# Patient Record
Sex: Female | Born: 1940 | Race: White | Hispanic: No | Smoking: Never smoker
Health system: Southern US, Community
[De-identification: ages and names within clinical notes are randomized; demographics above are authoritative.]

## PROBLEM LIST (undated history)

## (undated) DIAGNOSIS — J45909 Unspecified asthma, uncomplicated: Secondary | ICD-10-CM

## (undated) HISTORY — DX: Unspecified asthma, uncomplicated: J45.909

---

## 2009-06-19 ENCOUNTER — Emergency Department: Payer: Self-pay | Admitting: Emergency Medicine

## 2010-08-05 ENCOUNTER — Inpatient Hospital Stay: Payer: Self-pay | Admitting: Internal Medicine

## 2011-08-26 IMAGING — CT CT ABD-PELV W/ CM
1 of 2 series · 15 of 32 positions shown, 19 images · IV contrast (isovue)
Comparison: None

REASON FOR EXAM: (1) abdominal pain, epigastric, neg us,; (2) abdominal
pain, epigastric, neg us,
COMMENTS:

PROCEDURE:     CT  - CT ABDOMEN / PELVIS  W  - August 05, 2010 [DATE]
RESULT:     History: Abdominal pain
TECHNIQUE: Multiple axial images of the abdomen and pelvis were performed
from the lung bases to the pubic symphysis, with p.o. contrast and with 100
ml of Isovue 370 intravenous contrast.

[Series 2: 3mm soft tissue · axial · 0.66mm/px · z∈[-563,-155]mm · 15 of 150 slices shown, 19 images]
[im 7/150  soft-tissue]
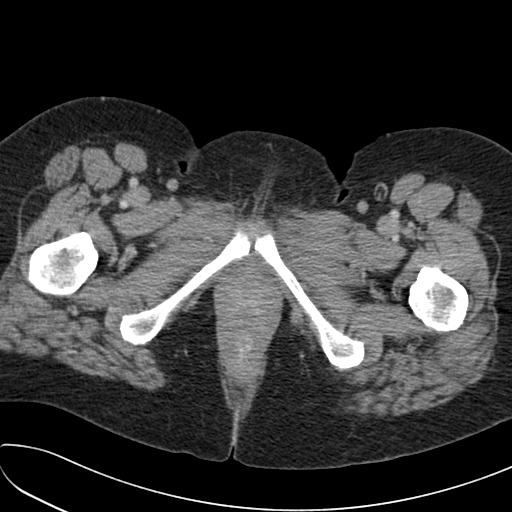
[im 7/150  bone]
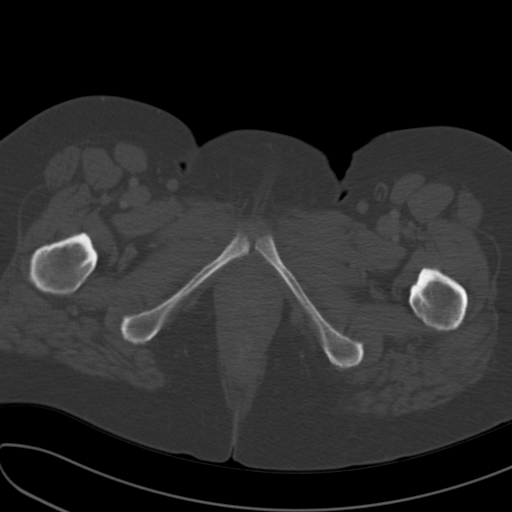
[im 20/150  soft-tissue]
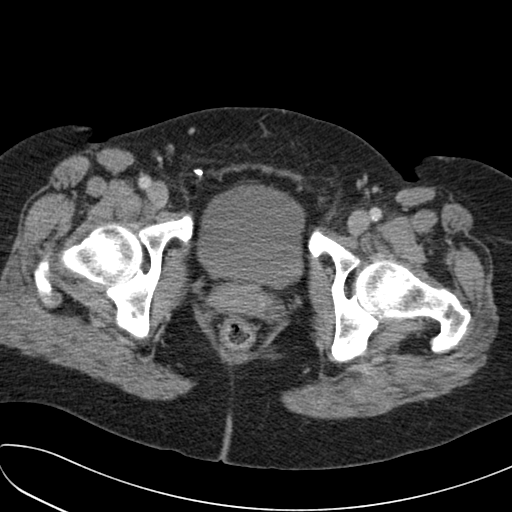
[im 33/150  soft-tissue]
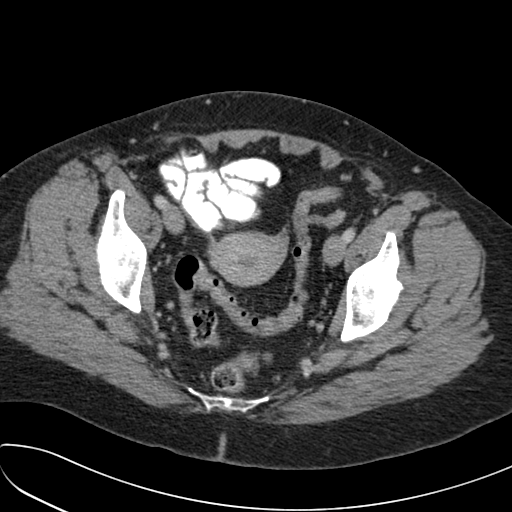
[im 39/150  soft-tissue]
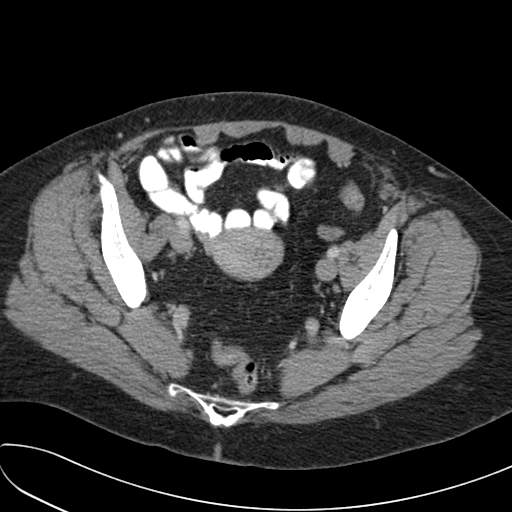
[im 52/150  soft-tissue]
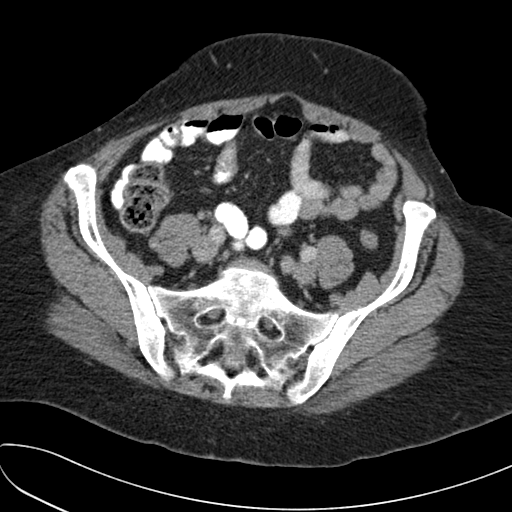
[im 65/150  soft-tissue]
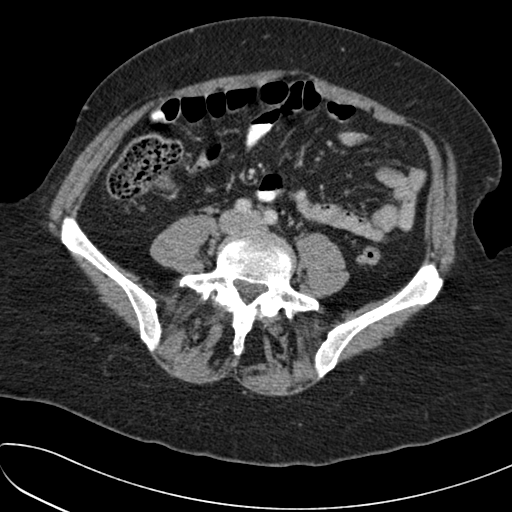
[im 78/150  soft-tissue]
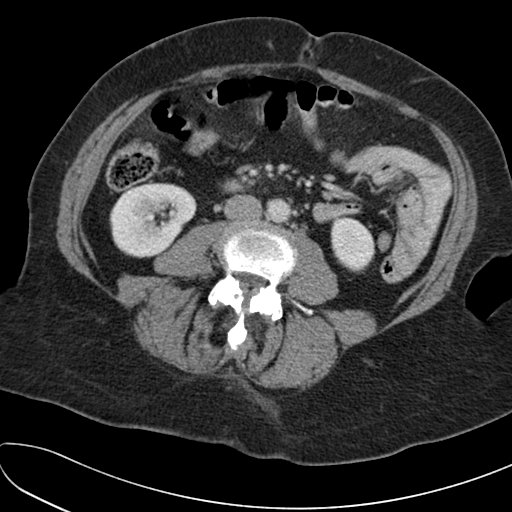
[im 85/150  soft-tissue]
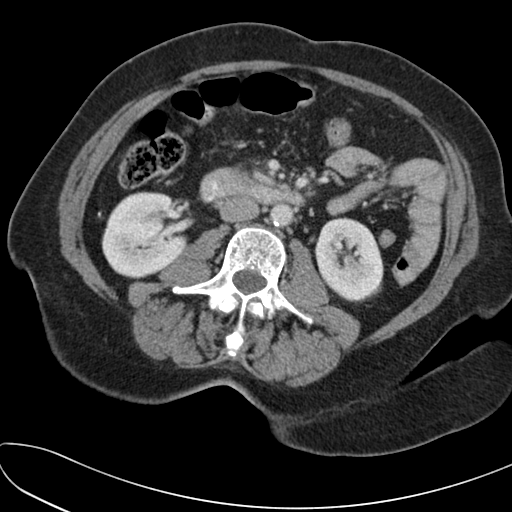
[im 98/150  soft-tissue]
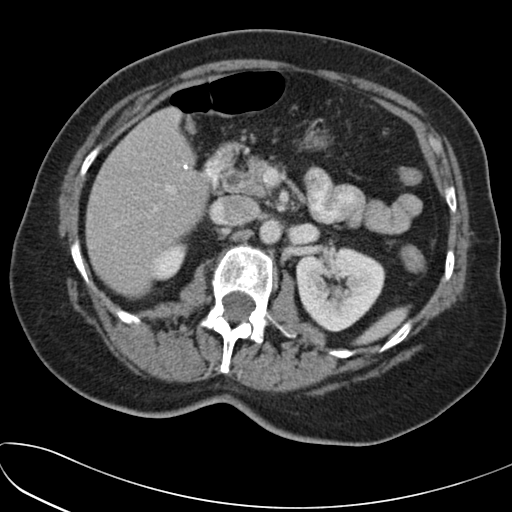
[im 98/150  bone]
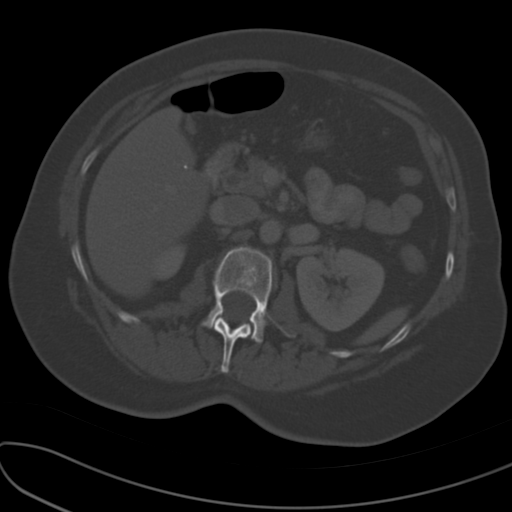
[im 111/150  soft-tissue]
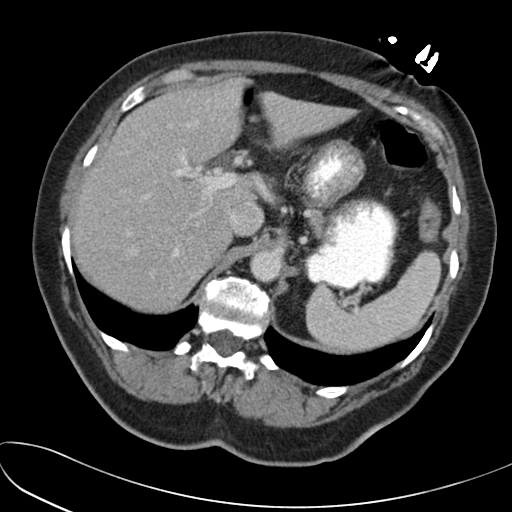
[im 117/150  soft-tissue]
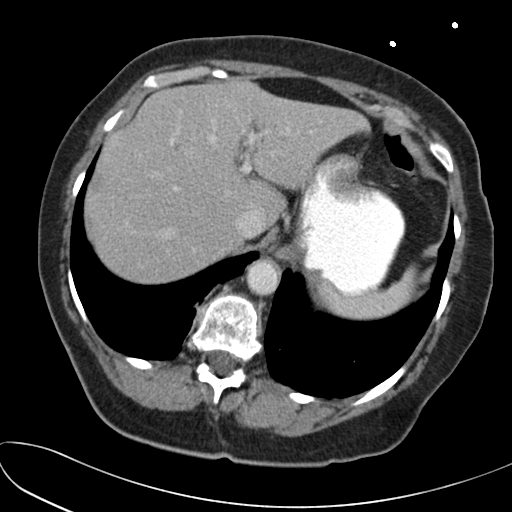
[im 124/150  lung]
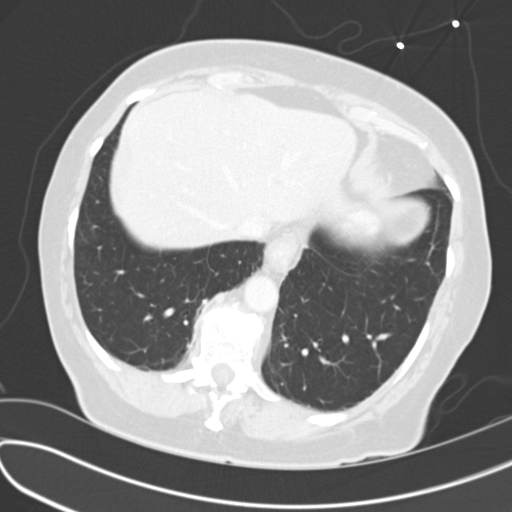
[im 130/150  soft-tissue]
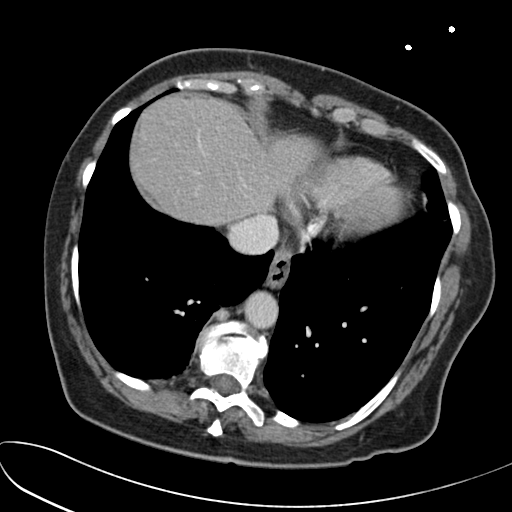
[im 130/150  lung]
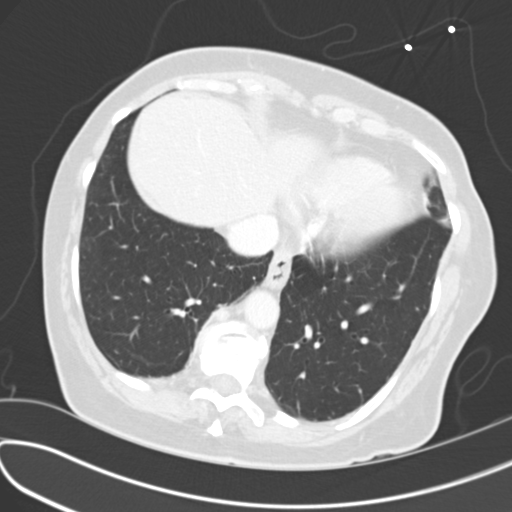
[im 137/150  lung]
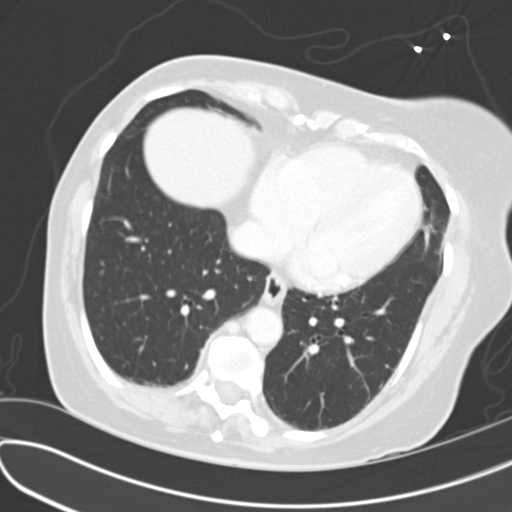
[im 143/150  soft-tissue]
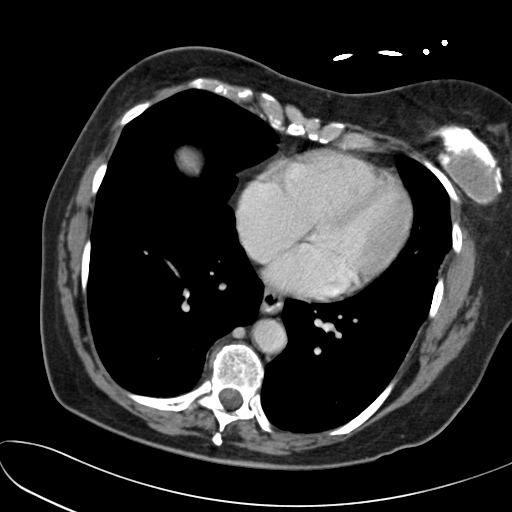
[im 143/150  lung]
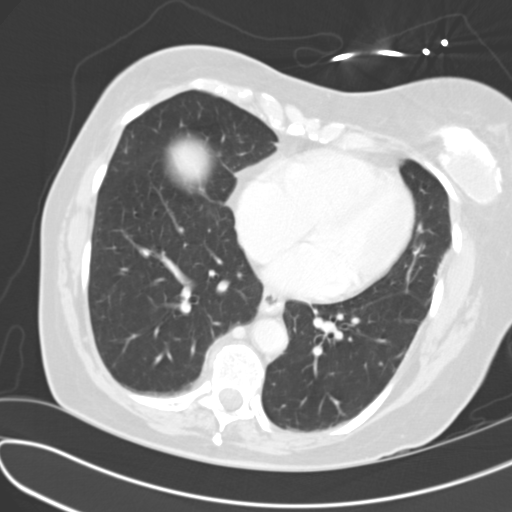

[15 of 32 positions shown; findings below may reference images not displayed]

FINDINGS: The lung bases are clear. There is no pneumothorax. The heart size is
normal.

The liver demonstrates no focal abnormality. There is no intrahepatic or
extrahepatic biliary ductal dilatation. The gallbladder is surgically
absent.. The spleen demonstrates no focal abnormality. The kidneys and
adrenal glands are unremarkable. There is hazy inflammatory change around
the pancreatic head concerning for mild pancreatitis. The bladder is
unremarkable.

The stomach, duodenum, small intestine, and large intestine demonstrate no
contrast extravasation or dilatation.  There is no pneumoperitoneum,
pneumatosis, or portal venous gas. There is no abdominal or pelvic free
fluid. There is no lymphadenopathy. There is a small amount of fluid in the
endometrial cavity.

The abdominal aorta is normal in caliber with atherosclerosis.

There is lumbar spine spondylosis.
IMPRESSION: There is hazy inflammatory change around the pancreatic head concerning for
mild pancreatitis. Correlate with laboratory values.

There is a small amount of fluid in the endometrial cavity. Correlate with
pelvic ultrasound.

## 2016-09-22 ENCOUNTER — Ambulatory Visit: Payer: Medicare Other | Attending: *Deleted

## 2016-09-22 DIAGNOSIS — G8929 Other chronic pain: Secondary | ICD-10-CM

## 2016-09-22 DIAGNOSIS — M25562 Pain in left knee: Secondary | ICD-10-CM | POA: Diagnosis present

## 2016-09-22 DIAGNOSIS — M25561 Pain in right knee: Secondary | ICD-10-CM | POA: Insufficient documentation

## 2016-09-22 DIAGNOSIS — R262 Difficulty in walking, not elsewhere classified: Secondary | ICD-10-CM

## 2016-09-22 DIAGNOSIS — M25552 Pain in left hip: Secondary | ICD-10-CM

## 2016-09-22 DIAGNOSIS — M6281 Muscle weakness (generalized): Secondary | ICD-10-CM

## 2016-09-22 NOTE — Patient Instructions (Signed)
(  Home) Hip: Internal Rotation - Sitting    Pull foot out. May hold chair to keep body still. Pain free range  Repeat ___10_ times per set. Do __3__ sets per session.   Copyright  VHI. All rights reserved.

## 2016-09-22 NOTE — Therapy (Signed)
[facility] [address], [place], [address] Phone: [phone]   Fax:  [phone]  Physical Therapy Evaluation  Patient Details  Name: Shari Wilson MRN: [id number] Date of Birth: [birth year] Referring Provider: Ceasar Lund, MD  Encounter Date: 09/22/2016      PT End of Session - 09/22/16 1036    Visit Number 1   Number of Visits 13   Date for PT Re-Evaluation 11/04/16   Authorization Type 1   Authorization Time Period of 10 gcode   PT Start Time 1036   PT Stop Time 1149   PT Time Calculation (min) 73 min   Activity Tolerance Patient tolerated treatment well   Behavior During Therapy [facility] for tasks assessed/performed      Past Medical History:  Diagnosis Date  . Asthma    allergies/asthma    No past surgical history on file.  There were no vitals filed for this visit.       Subjective Assessment - 09/22/16 1043    Subjective L hip: 0/10 currently, 5/10 at most (for the past month). L knee pain 0/10 currently, and 6/10 at worst for the past month   Pertinent History Arthralgia multiple sites. Pt states sometimes getting a sharp pain in her L hip when walking which disappears quickly. Feels like a nerve gets pinched.  Pt also states getting L knee pain below the knee cap and her shin (feels like a muscle or nerve) and feels like she has no strength there. Symptoms occur sporadic and does not know what causes it. Does not like the surprise occurence of the symptoms which always occurs on the L side.  Pain has been occuring at least 2 years or more. Turning suddenly or twisting on her L LE bothers it.  Has not had imaging for her L hip and knee. No back pain. No bowel or bladder problems or LE paresthsias.  Sitting for an hour causes bilateral knee stiffness.  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 Has to pause after standing and limber her knees up prior to walking.   Pain can happen several times a day but pain can occur as rarely as none for weeks.    Patient Stated Goals Get rid of the pain, sudden loss of power of her leg.    Currently in Pain? No/denies   Pain Score 0-No pain   Pain Location Hip  and knee   Pain Orientation Left   Pain Descriptors / Indicators Burning;Sharp  stiff   Pain Type Chronic pain   Pain Onset More than a month ago   Pain Frequency Occasional   Aggravating Factors  pivoting on her L LE, walking, standing up after prolonged sitting (an hour)   Pain Relieving Factors moving her legs            [facility] PT Assessment - 09/22/16 1039      Assessment   Medical Diagnosis Arthralgia multiple sites (L hip and L > R knee)   Referring Provider Ceasar Lund, MD   Onset Date/Surgical Date 08/26/16  Date PT referral signed. Chronic condition.   Prior Therapy no known therapy for current condition     Precautions   Precaution Comments No known precautions     Restrictions   Other Position/Activity Restrictions no known restrictions  Balance Screen   Has the patient fallen in the past 6 months No   Has the patient had a decrease in activity level because of a fear of falling?  No   Is the patient reluctant to leave their home because of a fear of falling?  No     Home Environment   Additional Comments Patient lives in a 2 story home with family.  2 steps to enter, no rail, 2 steps inside with L rail     Prior Function   Vocation Retired   [employer] PLOF: no pain with walking and turning, less symptoms with standing up after prolonged sitting     Observation/Other Assessments   Observations Walking and pivoting on L LE to the L reproduces L hip symptoms.  Reproduction of L hip symptoms with L hip quadrant testing with pressure (no pain with distraction at joint). Possible reproduction of L knee symptoms  with L knee flexion and medial rotation of tibia.   Reproduction of L lateral hip pain with medial glide to L hip joint.    Lower Extremity Functional Scale  37/80     Posture/Postural Control   Posture Comments protracted neck, increased lordosis, slight L lateral shift, slight R thoracic rotation, L foot pronation > R, bilaterally protracted shoulders     AROM   Overall AROM Comments 90/90 hip: WFL bilateral hip IR, slightly limited R hip ER compared to L.    Lumbar Flexion full with bilateral posterior LE pulling   Lumbar Extension WFL (no pain with overpressure)   Lumbar - Right Side Bend WFL (no pain with overpressure)   Lumbar - Left Side Bend WFL (possible L hip symptoms with over pressure)   Lumbar - Right Rotation WFL, no pain with over pressure.    Lumbar - Left Rotation WFL, no pain with overpressure.      Strength   Right Hip Flexion 4/5   Right Hip Extension 3+/5   Right Hip External Rotation  4/5   Right Hip ABduction 4+/5   Left Hip Flexion 4/5   Left Hip Extension 3/5   Left Hip External Rotation 4/5   Left Hip ABduction 4/5   Right Knee Flexion 4+/5   Right Knee Extension 4+/5   Left Knee Flexion 4+/5   Left Knee Extension 4+/5     Palpation   Palpation comment decreased P to A to L fibular head. Good L patellar mobility. No TTP L hip. Slight decreased P to A and inferior glide mobility to L hip joint.      Ambulation/Gait   Gait Comments forward flexed, decreased stance L LE, independent gait      Objectives  Reproduction of L lateral hip pain with medial glide to L hip joint   Manual therapy  Inferior glide to L hip joint grade 3. L lateral hip symptoms which eases with release of pressure  Supine long axis L hip distraction   Then in hooklying position to promote gentle joint mobility   No L hip pain with pivoting to the L on her L LE while walking afterwards    There-ex   Seated L hip IR 10x3  Reviewed and given as part of her HEP. Pt  demonstrated and verbalized understanding.   Improved exercise technique, movement at target joints, use of target muscles after mod verbal, visual, tactile cues.  PT Education - 09/22/16 1236    Education provided Yes   Education Details ther-ex, HEP, plan of care   Person(s) Educated Patient   Methods Explanation;Demonstration;Tactile cues;Verbal cues;Handout   Comprehension Verbalized understanding;Returned demonstration             PT Long Term Goals - 09/22/16 10/29/1216      PT LONG TERM GOAL #1   Title Patient will have a decrease in L hip and knee pain to 3/10 or less at worst to promote ability to ambulate and perform functional tasks with less pain.   Baseline 5/10 L hip and 6/10 L knee at worst (09/22/2016)   Time 6   Period Weeks   Status New     PT LONG TERM GOAL #2   Title Patient will improve her LEFS score by at least 9 points as a demonstration of improved function.   Baseline 37/80 (09/22/2016)   Time 6   Period Weeks   Status New     PT LONG TERM GOAL #3   Title Patient will improve bilateral hip strength by at least 1/2 MMT grade to promote ability to perform standing tasks with less symptoms.    Time 6   Period Weeks   Status New               Plan - 09/22/16 1200    Clinical Impression Statement Patient is a 76 year old female who came to physical therapy secondary to L hip, L knee pain and R knee pain. She also presents with reproduction of symptoms with walking and pivoting on her L LE, as well as with L hip flexion/adduction/and IR, medial glide to L hip joint; bilateral glute weakness, antalgic gait pattern, slight stiffness at her L hip and knee joints, and difficulty performing functional tasks such as walking and turning, and standing up after prolonged sitting. Patient will benefit from skilled physical therapy intervention to address the aforementioned deficits.    Rehab Potential Good   Clinical  Impairments Affecting Rehab Potential chronicity of condition   PT Frequency 2x / week   PT Duration 6 weeks   PT Treatment/Interventions Therapeutic exercise;Therapeutic activities;Dry needling;Patient/family education;Manual techniques;Neuromuscular re-education;Balance training;Gait training;Ultrasound;Traction;Iontophoresis 4mg /ml Dexamethasone;Electrical Stimulation;Aquatic Therapy   PT Next Visit Plan manual therapy, hip and knee strengthening   Consulted and Agree with Plan of Care Patient      Patient will benefit from skilled therapeutic intervention in order to improve the following deficits and impairments:  Pain, Improper body mechanics, Difficulty walking, Decreased strength  Visit Diagnosis: Pain in left hip - Plan: PT plan of care cert/re-cert  Chronic pain of left knee - Plan: PT plan of care cert/re-cert  Difficulty in walking, not elsewhere classified - Plan: PT plan of care cert/re-cert  Muscle weakness (generalized) - Plan: PT plan of care cert/re-cert  Chronic pain of right knee - Plan: PT plan of care cert/re-cert      G-Codes - [id number] 10-30-1230    Functional Assessment Tool Used (Outpatient Only) LEFS, clinical presentation, patient interview   Functional Limitation Mobility: Walking and moving around   Mobility: Walking and Moving Around Current Status [id number]) At least 40 percent but less than 60 percent impaired, limited or restricted   Mobility: Walking and Moving Around Goal Status [id number]) At least 20 percent but less than 40 percent impaired, limited or restricted       Problem List There are no active problems to display for this patient.   [facility]  PT, DPT   09/22/2016, 5:38 PM  [facility] [address] [place], [address] Phone: [phone]   Fax:  [phone]  Name: Bexleigh Dehnel MRN: [id number] Date of Birth: Feb 14, 1941

## 2016-09-27 ENCOUNTER — Ambulatory Visit: Payer: Medicare Other | Attending: *Deleted

## 2016-09-27 DIAGNOSIS — M25562 Pain in left knee: Secondary | ICD-10-CM | POA: Diagnosis present

## 2016-09-27 DIAGNOSIS — M25561 Pain in right knee: Secondary | ICD-10-CM | POA: Insufficient documentation

## 2016-09-27 DIAGNOSIS — M25552 Pain in left hip: Secondary | ICD-10-CM | POA: Diagnosis not present

## 2016-09-27 DIAGNOSIS — M6281 Muscle weakness (generalized): Secondary | ICD-10-CM | POA: Insufficient documentation

## 2016-09-27 DIAGNOSIS — R262 Difficulty in walking, not elsewhere classified: Secondary | ICD-10-CM | POA: Diagnosis present

## 2016-09-27 DIAGNOSIS — G8929 Other chronic pain: Secondary | ICD-10-CM | POA: Diagnosis present

## 2016-09-27 NOTE — Therapy (Signed)
[facility] [address], [place], [address] Phone: [phone]   Fax:  [phone]  Physical Therapy Treatment  Patient Details  Name: Shari Wilson MRN: [id number] Date of Birth: 01-24-1941 Referring Provider: Ceasar Lund, MD  Encounter Date: 09/27/2016      PT End of Session - 09/27/16 1559    Visit Number 2   Number of Visits 13   Date for PT Re-Evaluation 11/04/16   Authorization Type 2   Authorization Time Period of 10 gcode   PT Start Time 1600   PT Stop Time 1643   PT Time Calculation (min) 43 min   Activity Tolerance Patient tolerated treatment well   Behavior During Therapy [facility] for tasks assessed/performed      Past Medical History:  Diagnosis Date  . Asthma    allergies/asthma    No past surgical history on file.  There were no vitals filed for this visit.      Subjective Assessment - 09/27/16 1600    Subjective Both hips feel sore due to arthritis. Walking or moving around helps it. Soreness is not all the time.  L hip felt good after last session. The L knee has been pretty good. Has not had the stumbling and the catching (in her hip).  Walking helps with her knees.    Pertinent History Arthralgia multiple sites. Pt states sometimes getting a sharp pain in her L hip when walking which disappears quickly. Feels like a nerve gets pinched.  Pt also states getting L knee pain below the knee cap and her shin (feels like a muscle or nerve) and feels like she has no strength there. Symptoms occur sporadic and does not know what causes it. Does not like the surprise occurence of the symptoms which always occurs on the L side.  Pain has been occuring at least 2 years or more. Turning suddenly or twisting on her L LE bothers it.  Has not had imaging for her L hip and knee. No back pain. No bowel or bladder problems or LE paresthsias.  Sitting for an hour causes bilateral knee stiffness.   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 Has to pause after standing and limber her knees up prior to walking.   Pain can happen several times a day but pain can occur as rarely as none for weeks.    Patient Stated Goals Get rid of the pain, sudden loss of power of her leg.    Currently in Pain? No/denies   Pain Score 0-No pain  no L hip pain, mild soreness in knee   Pain Onset More than a month ago            [facility] PT Assessment - 09/27/16 1915      Observation/Other Assessments   Observations (+) long sit test suggesting anterior nutation of L innominate                             PT Education - 09/27/16 1629    Education provided Yes   Education Details ther-ex, HEP   Person(s) Educated Patient   Methods Explanation;Demonstration;Tactile cues;Verbal cues;Handout   Comprehension Verbalized understanding;Returned demonstration        Objectives  Ther-ex  Directed patient with gait and L turns. No pain  Long sit test suggests anterior nutation of L innominate  Seated L hip extension isometrics 10x5 seconds for 2 sets. (-) long sit test afterwards  Reviewed and given as part of her HEP 10x5 seconds 2-3 sets daily. Pt demonstrated and verbalized understanding.   Handout provided  Seated hip adduction ball squeeze with glute max squeeze 10x2 with 5 second holds  Forward step up with L LE onto 3 inch step with emphasis on femoral control, R UE assist 3x5  Side stepping on Air Ex beam 5x each direction to promote glute med strengthening. Bilateral medial and lateral knee sensation. Decreases with rest.   Seated manually resisted L knee flexion 10x2 to promote hamstring strengthening.   Seated LAQ resisting 2 lbs 10x2 to promote quad strengthening.     Improved exercise technique, movement at target joints, use of target muscles  after mod verbal, visual, tactile cues.      Manual therapy  hooklying L hip distraction to promote gentle joint mobility     No L hip pain with turning to the L and pivoting on L LE today. (+) long sit test suggesting anterior nutation of L innominate. (-) long sit test after performing seated L hip extension isometrics. Worked on L glute strengthening to improve lumbopelvic position in addition to glute med, quadriceps and hamstring strengthening to help with hip and knee symptoms. Pt tolerated session well without aggravation of symptoms.        PT Long Term Goals - 09/22/16 1218      PT LONG TERM GOAL #1   Title Patient will have a decrease in L hip and knee pain to 3/10 or less at worst to promote ability to ambulate and perform functional tasks with less pain.   Baseline 5/10 L hip and 6/10 L knee at worst (09/22/2016)   Time 6   Period Weeks   Status New     PT LONG TERM GOAL #2   Title Patient will improve her LEFS score by at least 9 points as a demonstration of improved function.   Baseline 37/80 (09/22/2016)   Time 6   Period Weeks   Status New     PT LONG TERM GOAL #3   Title Patient will improve bilateral hip strength by at least 1/2 MMT grade to promote ability to perform standing tasks with less symptoms.    Time 6   Period Weeks   Status New               Plan - 09/27/16 1554    Clinical Impression Statement No L hip pain with turning to the L and pivoting on L LE today. (+) long sit test suggesting anterior nutation of L innominate. (-) long sit test after performing seated L hip extension isometrics. Worked on L glute strengthening to improve lumbopelvic position in addition to glute med, quadriceps and hamstring strengthening to help with hip and knee symptoms. Pt tolerated session well without aggravation of symptoms.    Rehab Potential Good   Clinical Impairments Affecting Rehab Potential chronicity of condition   PT Frequency 2x / week   PT  Duration 6 weeks   PT Treatment/Interventions Therapeutic exercise;Therapeutic activities;Dry needling;Patient/family education;Manual techniques;Neuromuscular re-education;Balance training;Gait training;Ultrasound;Traction;Iontophoresis 4mg /ml Dexamethasone;Electrical Stimulation;Aquatic Therapy   PT Next Visit Plan manual therapy, hip and knee strengthening   Consulted and Agree with Plan of Care Patient      Patient will benefit from skilled therapeutic intervention in order to improve the following deficits and  impairments:  Pain, Improper body mechanics, Difficulty walking, Decreased strength  Visit Diagnosis: Pain in left hip  Chronic pain of left knee  Difficulty in walking, not elsewhere classified  Muscle weakness (generalized)  Chronic pain of right knee     Problem List There are no active problems to display for this patient.  Joneen Boers PT, DPT   09/27/2016, 7:22 PM  [facility] [address], [place], [address] Phone: [phone]   Fax:  [phone]  Name: Shari Wilson MRN: [id number] Date of Birth: 10/26/1940

## 2016-09-27 NOTE — Patient Instructions (Addendum)
Gave seated L hip extension isometrics 10x5 seconds for 2-3 sets daily. Handout provided. Pt demonstrated and verbalized understanding.

## 2016-09-30 ENCOUNTER — Ambulatory Visit: Payer: Medicare Other

## 2016-09-30 DIAGNOSIS — G8929 Other chronic pain: Secondary | ICD-10-CM

## 2016-09-30 DIAGNOSIS — M25562 Pain in left knee: Principal | ICD-10-CM

## 2016-09-30 DIAGNOSIS — R262 Difficulty in walking, not elsewhere classified: Secondary | ICD-10-CM

## 2016-09-30 DIAGNOSIS — M25552 Pain in left hip: Secondary | ICD-10-CM

## 2016-09-30 DIAGNOSIS — M6281 Muscle weakness (generalized): Secondary | ICD-10-CM

## 2016-09-30 DIAGNOSIS — M25561 Pain in right knee: Secondary | ICD-10-CM

## 2016-09-30 NOTE — Therapy (Signed)
[facility] [address] [place], [address] Phone: [phone]   Fax:  [phone]  Physical Therapy Treatment  Patient Details  Name: Shari Wilson MRN: [id number] Date of Birth: 1940/11/03 Referring Provider: Ceasar Lund, MD  Encounter Date: 09/30/2016      PT End of Session - 09/30/16 1512    Visit Number 3   Number of Visits 13   Date for PT Re-Evaluation 11/04/16   Authorization Type 3   Authorization Time Period of 10 gcode   PT Start Time 1512   PT Stop Time 1556   PT Time Calculation (min) 44 min   Activity Tolerance Patient tolerated treatment well   Behavior During Therapy [facility] for tasks assessed/performed      Past Medical History:  Diagnosis Date  . Asthma    allergies/asthma    No past surgical history on file.  There were no vitals filed for this visit.      Subjective Assessment - 09/30/16 1513    Subjective Feel L medial knee discomfort when she walks. Yesterday and today, pt feels her hip and knee acting up. 1-2/10 L medial knee pain when walking,  L hip and knee felt good after last session. Ran errands today including carrying groceries.  Pt also states carrying potted plants, one in each hand yesterday about 1-2 lbs a piece multiple times yesterday.  Also feels symptoms going down steps.    Pertinent History Arthralgia multiple sites. Pt states sometimes getting a sharp pain in her L hip when walking which disappears quickly. Feels like a nerve gets pinched.  Pt also states getting L knee pain below the knee cap and her shin (feels like a muscle or nerve) and feels like she has no strength there. Symptoms occur sporadic and does not know what causes it. Does not like the surprise occurence of the symptoms which always occurs on the L side.  Pain has been occuring at least 2 years or more. Turning suddenly or twisting on her L LE bothers it.  Has not had imaging for her L  hip and knee. No back pain. No bowel or bladder problems or LE paresthsias.  Sitting for an hour causes bilateral knee stiffness.  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 Has to pause after standing and limber her knees up prior to walking.   Pain can happen several times a day but pain can occur as rarely as none for weeks.    Patient Stated Goals Get rid of the pain, sudden loss of power of her leg.    Currently in Pain? Yes   Pain Score 2   L medial knee pain   Pain Onset More than a month ago                                 PT Education - 09/30/16 1520    Education provided Yes   Education Details ther-ex   [employer]) Educated Patient   Methods Explanation;Demonstration;Tactile cues;Verbal cues   Comprehension Verbalized understanding;Returned demonstration        Objectives    Ther-ex  Directed patient with gait and L turns. No pain  Ascending and descending 4 regular steps with L rail assist (to mimic home steps) 2x2, then 1x2  with femoral control.    Pain with stepping up with L LE. L femoral IR   Decreased pain with addition of femoral control and glute max squeeze   Seated manually resisted L knee flexion 10x3 to promote hamstring strengthening.    Decreased L knee pain with going up stairs, L rail assist  Seated hip adduction ball squeeze with glute max squeeze 10x2 with 5 second holds  Seated LAQ resisting 2 lbs 10x2 to promote quad strengthening.   Then 10x5 seconds  Seated clamshell (hips < 90 degrees) with manual resistance 10x2 with 5 second holds to promote glute med strengthening  SLS on L LE with R tip toe assist, emphasis on pelvic control 5x5 seconds. L medial knee discomfort. Eases with rest.   Standing L hip abduction with bilateral UE assist 10x2 for glute med strengthening  Forward step up with L LE  onto 3 inch step with emphasis on femoral control, R UE assist 3x5     Improved exercise technique, movement at target joints, use of target muscles after mod verbal, visual, tactile cues.     Decreased L medial knee pain with ascending stairs after hamstring muscle activation and femoral control. Continued working on quadriceps and glute med muscle strength as well to help decrease L hip and knee pain when performing functional tasks at home.                   PT Long Term Goals - 09/22/16 1218      PT LONG TERM GOAL #1   Title Patient will have a decrease in L hip and knee pain to 3/10 or less at worst to promote ability to ambulate and perform functional tasks with less pain.   Baseline 5/10 L hip and 6/10 L knee at worst (09/22/2016)   Time 6   Period Weeks   Status New     PT LONG TERM GOAL #2   Title Patient will improve her LEFS score by at least 9 points as a demonstration of improved function.   Baseline 37/80 (09/22/2016)   Time 6   Period Weeks   Status New     PT LONG TERM GOAL #3   Title Patient will improve bilateral hip strength by at least 1/2 MMT grade to promote ability to perform standing tasks with less symptoms.    Time 6   Period Weeks   Status New               Plan - 09/30/16 1525    Clinical Impression Statement Decreased L medial knee pain with ascending stairs after hamstring muscle activation and femoral control. Continued working on quadriceps and glute med muscle strength as well to help decrease L hip and knee pain when performing functional tasks at home.    Rehab Potential Good   Clinical Impairments Affecting Rehab Potential chronicity of condition   PT Frequency 2x / week   PT Duration 6 weeks   PT Treatment/Interventions Therapeutic exercise;Therapeutic activities;Dry needling;Patient/family education;Manual techniques;Neuromuscular re-education;Balance training;Gait training;Ultrasound;Traction;Iontophoresis 4mg /ml  Dexamethasone;Electrical Stimulation;Aquatic Therapy   PT Next Visit Plan manual therapy, hip and knee strengthening   Consulted and Agree with Plan of Care Patient      Patient will benefit from skilled therapeutic intervention in order to improve the following deficits and impairments:  Pain, Improper body mechanics, Difficulty walking, Decreased strength  Visit Diagnosis: Chronic pain of left knee  Pain in left hip  Difficulty in walking, not elsewhere classified  Muscle weakness (generalized)  Chronic pain of right knee     Problem List There are no active problems to display for this patient.    Joneen Boers PT, DPT  09/30/2016, 7:46 PM  [place] [facility] [facility] [address] [place], [address] Phone: [phone]   Fax:  [phone]  Name: Shari Wilson MRN: [id number] Date of Birth: [birth year]

## 2016-10-05 ENCOUNTER — Ambulatory Visit: Payer: Medicare Other

## 2016-10-07 ENCOUNTER — Ambulatory Visit: Payer: Medicare Other

## 2016-10-07 DIAGNOSIS — R262 Difficulty in walking, not elsewhere classified: Secondary | ICD-10-CM

## 2016-10-07 DIAGNOSIS — M25552 Pain in left hip: Secondary | ICD-10-CM | POA: Diagnosis not present

## 2016-10-07 DIAGNOSIS — M6281 Muscle weakness (generalized): Secondary | ICD-10-CM

## 2016-10-07 DIAGNOSIS — G8929 Other chronic pain: Secondary | ICD-10-CM

## 2016-10-07 DIAGNOSIS — M25562 Pain in left knee: Secondary | ICD-10-CM

## 2016-10-07 DIAGNOSIS — M25561 Pain in right knee: Secondary | ICD-10-CM

## 2016-10-07 NOTE — Therapy (Signed)
[facility] [address], [place], [address] Phone: [phone]   Fax:  [phone]  Physical Therapy Treatment  Patient Details  Name: Shari Wilson MRN: [id number] Date of Birth: 1941-05-26 Referring Provider: Ceasar Lund, MD  Encounter Date: 10/07/2016      PT End of Session - 10/07/16 1357    Visit Number 4   Number of Visits 13   Date for PT Re-Evaluation 11/04/16   Authorization Type 4   Authorization Time Period of 10 gcode   PT Start Time 1357   PT Stop Time 1444   PT Time Calculation (min) 47 min   Activity Tolerance Patient tolerated treatment well   Behavior During Therapy [facility] for tasks assessed/performed      Past Medical History:  Diagnosis Date  . Asthma    allergies/asthma    No past surgical history on file.  There were no vitals filed for this visit.      Subjective Assessment - 10/07/16 1359    Subjective L hip and L knee feel pretty good today. 0/10 pain currently. Did pretty good for the past 7 days. Legs do not feel as heavy as she walks. 0-1/10 L knee and L hip pain at most for the past 7 days.  No R knee pain currently and for the past 7 days.    Pertinent History Arthralgia multiple sites. Pt states sometimes getting a sharp pain in her L hip when walking which disappears quickly. Feels like a nerve gets pinched.  Pt also states getting L knee pain below the knee cap and her shin (feels like a muscle or nerve) and feels like she has no strength there. Symptoms occur sporadic and does not know what causes it. Does not like the surprise occurence of the symptoms which always occurs on the L side.  Pain has been occuring at least 2 years or more. Turning suddenly or twisting on her L LE bothers it.  Has not had imaging for her L hip and knee. No back pain. No bowel or bladder problems or LE paresthsias.  Sitting for an hour causes bilateral knee stiffness.  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 Has to pause after standing and limber her knees up prior to walking.   Pain can happen several times a day but pain can occur as rarely as none for weeks.    Patient Stated Goals Get rid of the pain, sudden loss of power of her leg.    Currently in Pain? No/denies   Pain Score 0-No pain   Pain Onset More than a month ago                                 PT Education - 10/07/16 1418    Education provided Yes   Education Details ther-ex, HEP   Person(s) Educated Patient   Methods Explanation;Demonstration;Tactile cues;Verbal cues;Handout   Comprehension Verbalized understanding;Returned demonstration        Objectives    Ther-ex  Directed patient with supine hip extension isometrics with leg straight 10x3 with 5 second holds. Reviewed and given as part of her HEP for L side only. Pt demonstrated and verbalized understanding.    Then 10x5 second holds for R  Seated L knee  flexion 10x3 to promote hamstring strengthening resisting yellow band.   Seated LAQ resisting 2 lbs 10x5 seconds for 2 sets to promote quadriceps strengthening.    Seated hip adduction ball squeeze with glute max squeeze 10x2 with 5 second holds               Seated clamshell (hips < 90 degrees) with manual resistance 10x2 with 5 second holds to promote glute med strengthening  Standing L hip abduction with bilateral UE assist 10x2 for glute med strengthening  Then for R hip abduction 10x   Ascending and descending 4 regular steps with L rail assist (to mimic home steps) with emphasis on femoral and pelvic control 6x             Improved exercise technique, movement at target joints, use of target muscles after mod verbal, visual, tactile cues.    No pain throughout session. Continued working on LE strengthening  (glute max, glute med, hamstrings, quadriceps) as well as pelvic and femoral control to promote ability to perform standing tasks with less hip and knee pain. Improving femoral control with stair negotiation ascending > descending. Difficulty with pelvic control with stair negotiation. Good carry over of decreased L knee discomfort from last session.           PT Long Term Goals - 09/22/16 1218      PT LONG TERM GOAL #1   Title Patient will have a decrease in L hip and knee pain to 3/10 or less at worst to promote ability to ambulate and perform functional tasks with less pain.   Baseline 5/10 L hip and 6/10 L knee at worst (09/22/2016)   Time 6   Period Weeks   Status New     PT LONG TERM GOAL #2   Title Patient will improve her LEFS score by at least 9 points as a demonstration of improved function.   Baseline 37/80 (09/22/2016)   Time 6   Period Weeks   Status New     PT LONG TERM GOAL #3   Title Patient will improve bilateral hip strength by at least 1/2 MMT grade to promote ability to perform standing tasks with less symptoms.    Time 6   Period Weeks   Status New               Plan - 10/07/16 1354    Clinical Impression Statement No pain throughout session. Continued working on LE strengthening (glute max, glute med, hamstrings, quadriceps) as well as pelvic and femoral control to promote ability to perform standing tasks with less hip and knee pain. Improving femoral control with stair negotiation ascending > descending. Difficulty with pelvic control with stair negotiation. Good carry over of decreased L knee discomfort from last session.    Rehab Potential Good   Clinical Impairments Affecting Rehab Potential chronicity of condition   PT Frequency 2x / week   PT Duration 6 weeks   PT Treatment/Interventions Therapeutic exercise;Therapeutic activities;Dry needling;Patient/family education;Manual techniques;Neuromuscular re-education;Balance training;Gait  training;Ultrasound;Traction;Iontophoresis 4mg /ml Dexamethasone;Electrical Stimulation;Aquatic Therapy   PT Next Visit Plan manual therapy, hip and knee strengthening   Consulted and Agree with Plan of Care Patient      Patient will benefit from skilled therapeutic intervention in order to improve the following deficits and impairments:  Pain, Improper body mechanics, Difficulty walking, Decreased strength  Visit Diagnosis: Pain in left hip  Chronic pain of left knee  Muscle weakness (generalized)  Chronic pain of right knee  Difficulty in walking, not elsewhere classified     Problem List There are no active problems to display for this patient.   Joneen Boers PT, DPT   10/07/2016, 4:22 PM  [facility] [address] [place], [address] Phone: [phone]   Fax:  [phone]  Name: Sennie Borden MRN: [id number] Date of Birth: Jan 13, 1941

## 2016-10-07 NOTE — Patient Instructions (Addendum)
Knee Flexion: Resisted (Sitting)     Not shown: other side of band attached to something sturdy.   Sit with band around left ankle.  Pull leg back (medium effort)  Repeat __10__ times per set. Do __3__ sets per session. Do __3__ sessions per day.  http://orth.exer.us/695   Copyright  VHI. All rights reserved.      Supine hip extension isometrics with leg straight 10x3 with 5 second holds. Reviewed and given as part of her HEP for L side only. Handout provided. Pt demonstrated and verbalized understanding.

## 2016-10-11 ENCOUNTER — Ambulatory Visit: Payer: Medicare Other

## 2016-10-11 DIAGNOSIS — M25552 Pain in left hip: Secondary | ICD-10-CM | POA: Diagnosis not present

## 2016-10-11 DIAGNOSIS — G8929 Other chronic pain: Secondary | ICD-10-CM

## 2016-10-11 DIAGNOSIS — M6281 Muscle weakness (generalized): Secondary | ICD-10-CM

## 2016-10-11 DIAGNOSIS — R262 Difficulty in walking, not elsewhere classified: Secondary | ICD-10-CM

## 2016-10-11 DIAGNOSIS — M25562 Pain in left knee: Principal | ICD-10-CM

## 2016-10-11 DIAGNOSIS — M25561 Pain in right knee: Secondary | ICD-10-CM

## 2016-10-11 NOTE — Therapy (Signed)
[facility] [address], [place], [address] Phone: [phone]   Fax:  [phone]  Physical Therapy Treatment  Patient Details  Name: Shari Wilson MRN: [id number] Date of Birth: 16-Sep-1940 Referring Provider: Ceasar Lund, MD  Encounter Date: 10/11/2016      PT End of Session - 10/11/16 1736    Visit Number 5   Number of Visits 13   Date for PT Re-Evaluation 11/04/16   Authorization Type 5   Authorization Time Period of 10 gcode   PT Start Time 1736   PT Stop Time 1817   PT Time Calculation (min) 41 min   Activity Tolerance Patient tolerated treatment well   Behavior During Therapy [facility] for tasks assessed/performed      Past Medical History:  Diagnosis Date  . Asthma    allergies/asthma    No past surgical history on file.  There were no vitals filed for this visit.      Subjective Assessment - 10/11/16 1737    Subjective L hip and knee feel pretty good. Just feels soreness under the knee caps (bilaterally). No bilateral knee pain in sitting but soreness when walking on them.    Pertinent History Arthralgia multiple sites. Pt states sometimes getting a sharp pain in her L hip when walking which disappears quickly. Feels like a nerve gets pinched.  Pt also states getting L knee pain below the knee cap and her shin (feels like a muscle or nerve) and feels like she has no strength there. Symptoms occur sporadic and does not know what causes it. Does not like the surprise occurence of the symptoms which always occurs on the L side.  Pain has been occuring at least 2 years or more. Turning suddenly or twisting on her L LE bothers it.  Has not had imaging for her L hip and knee. No back pain. No bowel or bladder problems or LE paresthsias.  Sitting for an hour causes bilateral knee stiffness.  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 Has to pause after standing and limber her knees up prior to walking.   Pain can happen several times a day but pain can occur as rarely as none for weeks.    Patient Stated Goals Get rid of the pain, sudden loss of power of her leg.    Currently in Pain? Yes   Pain Score 1   bilateral medial and lateral knee joint soreness L > R   Pain Onset More than a month ago                                 PT Education - 10/11/16 1741    Education provided Yes   Education Details ther-ex, HEP   Person(s) Educated Patient   Methods Explanation;Demonstration;Tactile cues;Verbal cues   Comprehension Verbalized understanding;Returned demonstration        Objectives    Ther-ex  Directed patient with seated hip adduction ball squeeze with glute max squeeze 10x3 with 5 second holds   Decreased bilateral joint soreness with gait  Seated clamshell resisting red band 10x3, hips less than 90 degrees flexion  Seated LAQ resisting 2 lbs 10x5 seconds for 2 sets to promote quadriceps strengthening.   S/L hip abduction 5x2 each LE to  promote glute med strength. PT assist to keep pelvis from rocking back  Ascending and descending 4 regular steps with L rail assist (to mimic home steps) with emphasis on femoral and pelvic control 6x. Better R femoral control with descending stairs with R UE assist compared to last session   SLS squat at total gym with opposite tip toe assist height 8 for 10x each LE to promote femoral control and LE strengthening.     Improved exercise technique, movement at target joints, use of target muscles after min to mod verbal, visual, tactile cues.      Decreased bilateral knee joint soreness after working on glute, and VMO muscle strengthening. Continued working on femoral control with closed chain activities such as stair negotiation and single leg squats using the total gym to promote  ability to carry items up and down her home with less hip and bilateral knee symptoms.          PT Long Term Goals - 09/22/16 1218      PT LONG TERM GOAL #1   Title Patient will have a decrease in L hip and knee pain to 3/10 or less at worst to promote ability to ambulate and perform functional tasks with less pain.   Baseline 5/10 L hip and 6/10 L knee at worst (09/22/2016)   Time 6   Period Weeks   Status New     PT LONG TERM GOAL #2   Title Patient will improve her LEFS score by at least 9 points as a demonstration of improved function.   Baseline 37/80 (09/22/2016)   Time 6   Period Weeks   Status New     PT LONG TERM GOAL #3   Title Patient will improve bilateral hip strength by at least 1/2 MMT grade to promote ability to perform standing tasks with less symptoms.    Time 6   Period Weeks   Status New               Plan - 10/11/16 1742    Clinical Impression Statement Decreased bilateral knee joint soreness after working on glute, and VMO muscle strengthening. Continued working on femoral control with closed chain activities such as stair negotiation and single leg squats using the total gym to promote ability to carry items up and down her home with less hip and bilateral knee symptoms.    Rehab Potential Good   Clinical Impairments Affecting Rehab Potential chronicity of condition   PT Frequency 2x / week   PT Duration 6 weeks   PT Treatment/Interventions Therapeutic exercise;Therapeutic activities;Dry needling;Patient/family education;Manual techniques;Neuromuscular re-education;Balance training;Gait training;Ultrasound;Traction;Iontophoresis 4mg /ml Dexamethasone;Electrical Stimulation;Aquatic Therapy   PT Next Visit Plan manual therapy, hip and knee strengthening   Consulted and Agree with Plan of Care Patient      Patient will benefit from skilled therapeutic intervention in order to improve the following deficits and impairments:  Pain, Improper body  mechanics, Difficulty walking, Decreased strength  Visit Diagnosis: Chronic pain of left knee  Pain in left hip  Muscle weakness (generalized)  Chronic pain of right knee  Difficulty in walking, not elsewhere classified     Problem List There are no active problems to display for this patient.   Joneen Boers PT, DPT   10/11/2016, 6:26 PM  [facility] [address], [place], [address] Phone: [phone]   Fax:  [phone]  Name: Torrey Horseman MRN: [id number] Date of Birth: 03-Oct-1940

## 2016-10-11 NOTE — Patient Instructions (Signed)
Adduction: Hip - Knees Together (Sitting)   Sit with two folded pillows between knees and feet shoulder width apart (not shown). Push knees together. Hold for _5__ seconds. Repeat _10__ times. Do __3_ times a day.  Copyright  VHI. All rights reserved.

## 2016-10-14 ENCOUNTER — Ambulatory Visit: Payer: Medicare Other

## 2016-10-14 DIAGNOSIS — G8929 Other chronic pain: Secondary | ICD-10-CM

## 2016-10-14 DIAGNOSIS — M6281 Muscle weakness (generalized): Secondary | ICD-10-CM

## 2016-10-14 DIAGNOSIS — M25552 Pain in left hip: Secondary | ICD-10-CM | POA: Diagnosis not present

## 2016-10-14 DIAGNOSIS — M25561 Pain in right knee: Secondary | ICD-10-CM

## 2016-10-14 DIAGNOSIS — M25562 Pain in left knee: Secondary | ICD-10-CM

## 2016-10-14 DIAGNOSIS — R262 Difficulty in walking, not elsewhere classified: Secondary | ICD-10-CM

## 2016-10-14 NOTE — Therapy (Signed)
[place] [facility] [address], [place], [address] Phone: [phone]   Fax:  [phone]  Physical Therapy Treatment  Patient Details  Name: Shari Wilson MRN: [id number] Date of Birth: Apr 19, 1941 Referring Provider: Ceasar Lund, MD  Encounter Date: 10/14/2016      PT End of Session - 10/14/16 0828    Visit Number 6   Number of Visits 13   Date for PT Re-Evaluation 11/04/16   Authorization Type 6   Authorization Time Period of 10 gcode   PT Start Time 0829   PT Stop Time 0914   PT Time Calculation (min) 45 min   Activity Tolerance Patient tolerated treatment well   Behavior During Therapy [facility] for tasks assessed/performed      Past Medical History:  Diagnosis Date  . Asthma    allergies/asthma    No past surgical history on file.  There were no vitals filed for this visit.      Subjective Assessment - 10/14/16 0830    Subjective Both knees ached when she got up this morning but went away when she started walking. No pain in L hip or bilateral knees currently.  1-2/10 L hip and bilateral knee pain at most for the past 7 days.    Pertinent History Arthralgia multiple sites. Pt states sometimes getting a sharp pain in her L hip when walking which disappears quickly. Feels like a nerve gets pinched.  Pt also states getting L knee pain below the knee cap and her shin (feels like a muscle or nerve) and feels like she has no strength there. Symptoms occur sporadic and does not know what causes it. Does not like the surprise occurence of the symptoms which always occurs on the L side.  Pain has been occuring at least 2 years or more. Turning suddenly or twisting on her L LE bothers it.  Has not had imaging for her L hip and knee. No back pain. No bowel or bladder problems or LE paresthsias.  Sitting for an hour causes bilateral knee stiffness.  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 Has to pause after standing and limber her knees up prior to walking.   Pain can happen several times a day but pain can occur as rarely as none for weeks.    Patient Stated Goals Get rid of the pain, sudden loss of power of her leg.    Currently in Pain? No/denies   Pain Score 0-No pain   Pain Onset More than a month ago                                 PT Education - 10/14/16 4431    Education provided Yes   Education Details ther-ex   Person(s) Educated Patient   Methods Explanation;Demonstration;Tactile cues;Verbal cues   Comprehension Returned demonstration;Verbalized understanding        Objectives    Ther-ex  Seated LAQ resisting 2 lbs 10x5 seconds for 2 setsR and L leg to promote quadricepsstrengthening.   Seated clamshell resisting red band 10x3, hips less than 90 degrees flexion   seated hip adduction ball squeeze with glute max squeeze 10x3 with 5 second holds  S/L hip abduction 5x2 each LE to promote glute med strength. PT assist to  keep pelvis from rocking back  Seated knee flexion resisting yellow band 10x2 each LE  SLS squat at total gym with opposite tip toe assist height 8 for 10x3 each LE to promote femoral control and LE strengthening. Increased difficulty with more of a tip toe position of other LE  Ascending and descending 4 regular steps with L rail assist (to mimic home steps) with emphasis onfemoral and pelvic control 7x, then 1x with bilateral UE assist.  Needed more cues today for femoral control but improved with practice.      Improved exercise technique, movement at target joints, use of target muscles after min to mod verbal, visual, tactile cues.    Pt needed more cues today for femoral control with stair negotiation but improved with practice. Demonstrates glute med weakness with closed chain activities. No  complain of hip or bilateral knee pain throughout session.        PT Long Term Goals - 09/22/16 1218      PT LONG TERM GOAL #1   Title Patient will have a decrease in L hip and knee pain to 3/10 or less at worst to promote ability to ambulate and perform functional tasks with less pain.   Baseline 5/10 L hip and 6/10 L knee at worst (09/22/2016)   Time 6   Period Weeks   Status New     PT LONG TERM GOAL #2   Title Patient will improve her LEFS score by at least 9 points as a demonstration of improved function.   Baseline 37/80 (09/22/2016)   Time 6   Period Weeks   Status New     PT LONG TERM GOAL #3   Title Patient will improve bilateral hip strength by at least 1/2 MMT grade to promote ability to perform standing tasks with less symptoms.    Time 6   Period Weeks   Status New               Plan - 10/14/16 1583    Clinical Impression Statement Pt needed more cues today for femoral control with stair negotiation but improved with practice. Demonstrates glute med weakness with closed chain activities. No complain of hip or bilateral knee pain throughout session.    Rehab Potential Good   Clinical Impairments Affecting Rehab Potential chronicity of condition   PT Frequency 2x / week   PT Duration 6 weeks   PT Treatment/Interventions Therapeutic exercise;Therapeutic activities;Dry needling;Patient/family education;Manual techniques;Neuromuscular re-education;Balance training;Gait training;Ultrasound;Traction;Iontophoresis 4mg /ml Dexamethasone;Electrical Stimulation;Aquatic Therapy   PT Next Visit Plan manual therapy, hip and knee strengthening   Consulted and Agree with Plan of Care Patient      Patient will benefit from skilled therapeutic intervention in order to improve the following deficits and impairments:  Pain, Improper body mechanics, Difficulty walking, Decreased strength  Visit Diagnosis: Pain in left hip  Chronic pain of left knee  Muscle weakness  (generalized)  Chronic pain of right knee  Difficulty in walking, not elsewhere classified     Problem List There are no active problems to display for this patient.  Joneen Boers PT, DPT   10/14/2016, 9:22 AM  [place] [facility] [address] [place], [address] Phone: [phone]   Fax:  [phone]  Name: Shari Wilson MRN: [id number] Date of Birth: [birth year]

## 2016-10-19 ENCOUNTER — Ambulatory Visit: Payer: Medicare Other

## 2016-10-19 DIAGNOSIS — M25562 Pain in left knee: Secondary | ICD-10-CM

## 2016-10-19 DIAGNOSIS — M25552 Pain in left hip: Secondary | ICD-10-CM | POA: Diagnosis not present

## 2016-10-19 DIAGNOSIS — R262 Difficulty in walking, not elsewhere classified: Secondary | ICD-10-CM

## 2016-10-19 DIAGNOSIS — M6281 Muscle weakness (generalized): Secondary | ICD-10-CM

## 2016-10-19 DIAGNOSIS — G8929 Other chronic pain: Secondary | ICD-10-CM

## 2016-10-19 DIAGNOSIS — M25561 Pain in right knee: Secondary | ICD-10-CM

## 2016-10-19 NOTE — Therapy (Signed)
[place] [facility] [address] [place], [address] Phone: [phone]   Fax:  [phone]  Physical Therapy Treatment  Patient Details  Name: Shari Wilson MRN: [id number] Date of Birth: 20-Oct-1940 Referring Provider: Ceasar Lund, MD  Encounter Date: 10/19/2016      PT End of Session - 10/19/16 1517    Visit Number 7   Number of Visits 13   Date for PT Re-Evaluation 11/04/16   Authorization Type 7   Authorization Time Period of 10 gcode   PT Start Time 1517   PT Stop Time 1600   PT Time Calculation (min) 43 min   Activity Tolerance Patient tolerated treatment well   Behavior During Therapy [facility] for tasks assessed/performed      Past Medical History:  Diagnosis Date  . Asthma    allergies/asthma    No past surgical history on file.  There were no vitals filed for this visit.      Subjective Assessment - 10/19/16 1519    Subjective Everything feels good right now. Felt sharp catching in her L hip a few times when going up steps yesterday. Other than that, things have been pretty good.  1-2/10 L hip pain (pinch/catch) and 2-3/10 L knee pain  at most for the past 7 days.     Pertinent History Arthralgia multiple sites. Pt states sometimes getting a sharp pain in her L hip when walking which disappears quickly. Feels like a nerve gets pinched.  Pt also states getting L knee pain below the knee cap and her shin (feels like a muscle or nerve) and feels like she has no strength there. Symptoms occur sporadic and does not know what causes it. Does not like the surprise occurence of the symptoms which always occurs on the L side.  Pain has been occuring at least 2 years or more. Turning suddenly or twisting on her L LE bothers it.  Has not had imaging for her L hip and knee. No back pain. No bowel or bladder problems or LE paresthsias.  Sitting for an hour causes bilateral knee stiffness.  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 Has to pause after standing and limber her knees up prior to walking.   Pain can happen several times a day but pain can occur as rarely as none for weeks.    Patient Stated Goals Get rid of the pain, sudden loss of power of her leg.    Currently in Pain? No/denies   Pain Score 0-No pain   Pain Onset More than a month ago                                 PT Education - 10/19/16 1528    Education provided Yes   Education Details ther-ex   [employer]) Educated Patient   Methods Explanation;Demonstration;Tactile cues;Verbal cues   Comprehension Verbalized understanding;Returned demonstration        Objectives       Ther-ex  Ascending and descending 4 regular steps with L rail assist (to mimic home steps) with emphasis onfemoral and pelvic control 6x. Improved bilateral femoral control. No pain.   Gait with turning and pivoting 180 degrees to the L 2x. No L hip pain.   S/L hip abduction 5x2  each LE to promote glute med strength. PT assist to keep pelvis from rocking back  Seated LAQ resisting 2 lbs 10x5 seconds for 2 setsR and L leg to promote quadricepsstrengthening.    SLS with opposite tip toe assist and bilateral UE assist to promote glute med strength on stance LE 10x5 seconds each LE for 2 sets   SLS squat at total gym with opposite tip toe assist height 8 for 10x2 then height 9 for 10x each LE to promote femoral control and LE strengthening.   Standing hip abduction with bilateral UE assist 10x each LE to promote glute med strengthening. L lateral knee discomfort with standing R hip abduction, eases with addition of gentle R trunk side bending.   Standing mini squats without UE assist, and with emphasis on femoral control 5x. No LOB.    Improved exercise technique, movement at target joints, use of  target muscles after mod verbal, visual, tactile cues.     Pt demonstrates decreased L hip and knee pain level at worst since initial evaluation. Pt making good progress with PT towards goals. Improved bilateral femoral control with stair negotiation.          PT Long Term Goals - 09/22/16 1218      PT LONG TERM GOAL #1   Title Patient will have a decrease in L hip and knee pain to 3/10 or less at worst to promote ability to ambulate and perform functional tasks with less pain.   Baseline 5/10 L hip and 6/10 L knee at worst (09/22/2016)   Time 6   Period Weeks   Status New     PT LONG TERM GOAL #2   Title Patient will improve her LEFS score by at least 9 points as a demonstration of improved function.   Baseline 37/80 (09/22/2016)   Time 6   Period Weeks   Status New     PT LONG TERM GOAL #3   Title Patient will improve bilateral hip strength by at least 1/2 MMT grade to promote ability to perform standing tasks with less symptoms.    Time 6   Period Weeks   Status New               Plan - 10/19/16 1516    Clinical Impression Statement Pt demonstrates decreased L hip and knee pain level at worst since initial evaluation. Pt making good progress with PT towards goals. Improved bilateral femoral control with stair negotiation.    Rehab Potential Good   Clinical Impairments Affecting Rehab Potential chronicity of condition   PT Frequency 2x / week   PT Duration 6 weeks   PT Treatment/Interventions Therapeutic exercise;Therapeutic activities;Dry needling;Patient/family education;Manual techniques;Neuromuscular re-education;Balance training;Gait training;Ultrasound;Traction;Iontophoresis 4mg /ml Dexamethasone;Electrical Stimulation;Aquatic Therapy   PT Next Visit Plan manual therapy, hip and knee strengthening   Consulted and Agree with Plan of Care Patient      Patient will benefit from skilled therapeutic intervention in order to improve the following deficits and  impairments:  Pain, Improper body mechanics, Difficulty walking, Decreased strength  Visit Diagnosis: Pain in left hip  Chronic pain of left knee  Muscle weakness (generalized)  Chronic pain of right knee  Difficulty in walking, not elsewhere classified     Problem List There are no active problems to display for this patient.   Joneen Boers PT, DPT   10/19/2016, 4:05 PM  [place] [facility] [address], [place], [address] Phone:  [phone]   Fax:  [phone]  Name: Derrian Rodak MRN: [id number] Date of Birth: 05-07-1941

## 2016-10-21 ENCOUNTER — Ambulatory Visit: Payer: Medicare Other

## 2016-10-21 DIAGNOSIS — R262 Difficulty in walking, not elsewhere classified: Secondary | ICD-10-CM

## 2016-10-21 DIAGNOSIS — M6281 Muscle weakness (generalized): Secondary | ICD-10-CM

## 2016-10-21 DIAGNOSIS — M25552 Pain in left hip: Secondary | ICD-10-CM | POA: Diagnosis not present

## 2016-10-21 DIAGNOSIS — M25562 Pain in left knee: Secondary | ICD-10-CM

## 2016-10-21 DIAGNOSIS — M25561 Pain in right knee: Secondary | ICD-10-CM

## 2016-10-21 DIAGNOSIS — G8929 Other chronic pain: Secondary | ICD-10-CM

## 2016-10-21 NOTE — Therapy (Signed)
[place] [facility] [address], [place], [address] Phone: [phone]   Fax:  [phone]  Physical Therapy Treatment  Patient Details  Name: Shari Wilson MRN: [id number] Date of Birth: [birth year] Referring Provider: Ceasar Lund, MD  Encounter Date: 10/21/2016      PT End of Session - 10/21/16 1429    Visit Number 8   Number of Visits 13   Date for PT Re-Evaluation 11/04/16   Authorization Type 8   Authorization Time Period of 10 gcode   PT Start Time 1429   PT Stop Time 1457   PT Time Calculation (min) 28 min   Activity Tolerance Patient tolerated treatment well   Behavior During Therapy [facility] for tasks assessed/performed      Past Medical History:  Diagnosis Date  . Asthma    allergies/asthma    No past surgical history on file.  There were no vitals filed for this visit.      Subjective Assessment - 10/21/16 1431    Subjective Both hips feel like there's an ache inside the joint when going up and down steps. Feels a catch.  The knees are fine.    Pertinent History Arthralgia multiple sites. Pt states sometimes getting a sharp pain in her L hip when walking which disappears quickly. Feels like a nerve gets pinched.  Pt also states getting L knee pain below the knee cap and her shin (feels like a muscle or nerve) and feels like she has no strength there. Symptoms occur sporadic and does not know what causes it. Does not like the surprise occurence of the symptoms which always occurs on the L side.  Pain has been occuring at least 2 years or more. Turning suddenly or twisting on her L LE bothers it.  Has not had imaging for her L hip and knee. No back pain. No bowel or bladder problems or LE paresthsias.  Sitting for an hour causes bilateral knee stiffness.  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 Has to pause after standing and limber her knees up prior to walking.   Pain can happen several times a day but pain can occur as rarely as none for weeks.    Patient Stated Goals Get rid of the pain, sudden loss of power of her leg.    Currently in Pain? Yes   Pain Score 1   bilateral hip ache when going up and down steps. 0/10 sitting   Pain Onset More than a month ago                                 PT Education - 10/21/16 1450    Education provided Yes   Education Details ther-ex   [employer]) Educated Patient   Methods Explanation;Demonstration;Tactile cues;Verbal cues   Comprehension Verbalized understanding;Returned demonstration        Objectives       Ther-ex  Ascending and descending 4 regular steps with L rail assist (to mimic home steps) with emphasis onfemoral and pelvic control 3x. Improved bilateral femoral control. No pain.   Gait with turning and pivoting 180 degrees to the L 2x. And to the R 2x. No R or L hip pain.  stading up from sitting and turning to the R. Slight  reproduction of R hip catching sensation.  Standing up from sitting and turning to the L. No symptom reproduction  Seated hip adduction ball squeeze with glute max squeeze 10x5 seconds for 2 sets  No symptoms with standing up from sitting and turning to the R  SLS squat at total gym with opposite tip toe assist height 9 for 10x2 each LE to promote femoral control and LE strengthening.   S/L hip abduction 5x3 R LE and 5x3 L LE to promote glute med strength. PT assist to keep pelvis from rocking back   Improved exercise technique, movement at target joints, use of target muscles after min to mod verbal, visual, tactile cues.    R hip catching sensation slightly reproduced with standing up from the sitting position and turning to the R (R hip IR). Decreased after activating her hip adductor, and glute max muscles with seated hip adduction ball  squeeze with glute max use. Improving femoral control with stair negotiation. Continued working on hip strengthening and femoral control to help promote ability to perform standing functional tasks with less symptoms.            PT Long Term Goals - 09/22/16 1218      PT LONG TERM GOAL #1   Title Patient will have a decrease in L hip and knee pain to 3/10 or less at worst to promote ability to ambulate and perform functional tasks with less pain.   Baseline 5/10 L hip and 6/10 L knee at worst (09/22/2016)   Time 6   Period Weeks   Status New     PT LONG TERM GOAL #2   Title Patient will improve her LEFS score by at least 9 points as a demonstration of improved function.   Baseline 37/80 (09/22/2016)   Time 6   Period Weeks   Status New     PT LONG TERM GOAL #3   Title Patient will improve bilateral hip strength by at least 1/2 MMT grade to promote ability to perform standing tasks with less symptoms.    Time 6   Period Weeks   Status New               Plan - 10/21/16 1429    Clinical Impression Statement R hip catching sensation slightly reproduced with standing up from the sitting position and turning to the R (R hip IR). Decreased after activating her hip adductor, and glute max muscles with seated hip adduction ball squeeze with glute max use. Improving femoral control with stair negotiation. Continued working on hip strengthening and femoral control to help promote ability to perform standing functional tasks with less symptoms.    Rehab Potential Good   Clinical Impairments Affecting Rehab Potential chronicity of condition   PT Frequency 2x / week   PT Duration 6 weeks   PT Treatment/Interventions Therapeutic exercise;Therapeutic activities;Dry needling;Patient/family education;Manual techniques;Neuromuscular re-education;Balance training;Gait training;Ultrasound;Traction;Iontophoresis 4mg /ml Dexamethasone;Electrical Stimulation;Aquatic Therapy   PT Next Visit Plan  manual therapy, hip and knee strengthening   Consulted and Agree with Plan of Care Patient      Patient will benefit from skilled therapeutic intervention in order to improve the following deficits and impairments:  Pain, Improper body mechanics, Difficulty walking, Decreased strength  Visit Diagnosis: Pain in left hip  Chronic pain of left knee  Muscle weakness (generalized)  Chronic pain of right knee  Difficulty in walking, not elsewhere classified     Problem List There are no active problems to display for  this patient.   Joneen Boers PT, DPT   10/21/2016, 6:44 PM  [place] [facility] [address] [place], [address] Phone: [phone]   Fax:  [phone]  Name: Shari Wilson MRN: [id number] Date of Birth: Mar 02, 1941

## 2016-10-25 ENCOUNTER — Ambulatory Visit: Payer: Medicare Other | Attending: *Deleted

## 2016-10-25 DIAGNOSIS — R262 Difficulty in walking, not elsewhere classified: Secondary | ICD-10-CM | POA: Diagnosis present

## 2016-10-25 DIAGNOSIS — M25561 Pain in right knee: Secondary | ICD-10-CM | POA: Insufficient documentation

## 2016-10-25 DIAGNOSIS — M25552 Pain in left hip: Secondary | ICD-10-CM | POA: Insufficient documentation

## 2016-10-25 DIAGNOSIS — M25562 Pain in left knee: Secondary | ICD-10-CM | POA: Diagnosis present

## 2016-10-25 DIAGNOSIS — G8929 Other chronic pain: Secondary | ICD-10-CM | POA: Diagnosis present

## 2016-10-25 DIAGNOSIS — M6281 Muscle weakness (generalized): Secondary | ICD-10-CM | POA: Insufficient documentation

## 2016-10-25 NOTE — Patient Instructions (Signed)
  Quadriceps Set (Prone)  Pillow under your abdomen for comfort.    With toes supporting lower legs, squeeze rear end muscles and tighten thigh muscles to straighten knees. Hold _5___ seconds. Relax. Repeat __5__ times per set. Do ___3_ sets per session. Do ___1_ sessions per day.  [email or web address].exer.us/726   Copyright  VHI. All rights reserved.

## 2016-10-25 NOTE — Therapy (Signed)
[place] [facility] [address], [place], [address] Phone: [phone]   Fax:  [phone]  Physical Therapy Treatment And Progress Report   Patient Details  Name: Shari Wilson MRN: [id number] Date of Birth: 29-Sep-1940 Referring Provider: Ceasar Lund, MD  Encounter Date: 10/25/2016      PT End of Session - 10/25/16 1420    Visit Number 9   Number of Visits 13   Date for PT Re-Evaluation 11/04/16   Authorization Type 1   Authorization Time Period of 10 gcode   PT Start Time 4332   PT Stop Time 1505   PT Time Calculation (min) 45 min   Activity Tolerance Patient tolerated treatment well   Behavior During Therapy [facility] for tasks assessed/performed      Past Medical History:  Diagnosis Date  . Asthma    allergies/asthma    No past surgical history on file.  There were no vitals filed for this visit.      Subjective Assessment - 10/25/16 1421    Subjective Both hips feel pretty good now. Felt a sharp pain in R hip this morning. Intermittent stuff. L hip cought when going up and down steps a couple of days ago. No pain currently. 1/10 R hip catching earlier.  1/10 L hip pain this weekend when it caught during stairs. The R hip was catching in the front (hip flexor), th L feels like it is catching in the joint.  1/10 bilateral hip and knee pain at most for the past 7 days.  Knees usually bother her in the morning or after yardwork due to stiffness.    Pertinent History Arthralgia multiple sites. Pt states sometimes getting a sharp pain in her L hip when walking which disappears quickly. Feels like a nerve gets pinched.  Pt also states getting L knee pain below the knee cap and her shin (feels like a muscle or nerve) and feels like she has no strength there. Symptoms occur sporadic and does not know what causes it. Does not like the surprise occurence of the symptoms which always occurs on the L side.   Pain has been occuring at least 2 years or more. Turning suddenly or twisting on her L LE bothers it.  Has not had imaging for her L hip and knee. No back pain. No bowel or bladder problems or LE paresthsias.  Sitting for an hour causes bilateral knee stiffness.  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 Has to pause after standing and limber her knees up prior to walking.   Pain can happen several times a day but pain can occur as rarely as none for weeks.    Patient Stated Goals Get rid of the pain, sudden loss of power of her leg.    Currently in Pain? No/denies   Pain Score 0-No pain   Pain Onset More than a month ago            [facility] PT Assessment - 10/25/16 1425      Observation/Other Assessments   Lower Extremity Functional Scale  64/80     Strength   Right Hip Flexion 4/5   Right Hip Extension 3+/5   Right Hip External Rotation  4+/5   Right Hip ABduction 4+/5   Left Hip Flexion 4+/5   Left Hip Extension 3+/5  Left Hip External Rotation 4+/5   Left Hip ABduction 4+/5                             PT Education - 10/25/16 1440    Education provided Yes   Education Details ther-ex, HEP, progress/current status with hip strength   Person(s) Educated Patient   Methods Explanation;Demonstration;Tactile cues;Verbal cues;Handout   Comprehension Verbalized understanding;Returned demonstration        Objectives      Ther-ex  Manually resisted seated hip flexion, ER, S/L abduction, prone hip extension 1-2x each way  Reviewed progress/current status with hip strength with pt  Prone glute max/quad set 5x3 with 5 second holds each LE  S/L hip abduction 5x3 R LE and 5x3 L LE to promote glute med strength. PT assist to keep pelvis from rocking back  SLS squat at total gym with opposite tip toe assist height 9 for 10x2 each LE then  without opposite LE assist 10x each LE to promote femoral control and LE strengthening.  Seated clamshell (hips less than 90 degrees)  red band 10x Seated hip adductor ball squeeze 10x5 seconds with glute max squeeze.   Side stepping 32 ft to the L, 32 ft to the R 2x to promote glute med strengthening.    Improved exercise technique, movement at target joints, use of target muscles after min to mod verbal, visual, tactile cues.    Pt demonstrates overall decreased bilateral hip and knee pain level at worst, improved LEFS score suggesting improved function, and slight improvement in bilateral hip strength since initial evaluation. Pt states experiencing occasional catching in both R and L hip with minimal pain, usually with stair negotiation or standing up from sitting and turning to the R. Bilateral knee pain appears to be improved with symptoms usually occurring in the morning of after yardwork due to stiffness based on patient reports. Pt still demonstrates bilateral hip weakness, especially with her gluteus maximus muscle, and difficulty with negotiating stairs secondary to bilateral hip catching and would benefit from continued skilled physical therapy services to address the aforementioned deficits.         PT Long Term Goals - 10/25/16 1509      PT LONG TERM GOAL #1   Title Patient will have a decrease in L hip and knee pain to 3/10 or less at worst to promote ability to ambulate and perform functional tasks with less pain.   Baseline 5/10 L hip and 6/10 L knee at worst (09/22/2016); 1/10 bilateral hip and knee pain at most for the past 7 days (10/25/2016)   Time 6   Period Weeks   Status Achieved     PT LONG TERM GOAL #2   Title Patient will improve her LEFS score by at least 9 points as a demonstration of improved function.   Baseline 37/80 (09/22/2016); 64/80 (10/25/2016)   Time 6   Period Weeks   Status Achieved     PT LONG TERM GOAL #3   Title Patient will improve bilateral hip  strength by at least 1/2 MMT grade to promote ability to perform standing tasks with less symptoms.    Time 6   Period Weeks   Status Partially Met               Plan - 10/25/16 1419    Clinical Impression Statement Pt demonstrates overall decreased bilateral hip and knee pain level at worst, improved LEFS  score suggesting improved function, and slight improvement in bilateral hip strength since initial evaluation. Pt states experiencing occasional catching in both R and L hip with minimal pain, usually with stair negotiation or standing up from sitting and turning to the R. Bilateral knee pain appears to be improved with symptoms usually occurring in the morning of after yardwork due to stiffness based on patient reports. Pt still demonstrates bilateral hip weakness, especially with her gluteus maximus muscle, and difficulty with negotiating stairs secondary to bilateral hip catching and would benefit from continued skilled physical therapy services to address the aforementioned deficits.    Rehab Potential Good   Clinical Impairments Affecting Rehab Potential chronicity of condition   PT Frequency 2x / week   PT Duration 6 weeks   PT Treatment/Interventions Therapeutic exercise;Therapeutic activities;Dry needling;Patient/family education;Manual techniques;Neuromuscular re-education;Balance training;Gait training;Ultrasound;Traction;Iontophoresis 93m/ml Dexamethasone;Electrical Stimulation;Aquatic Therapy   PT Next Visit Plan manual therapy, hip and knee strengthening   Consulted and Agree with Plan of Care Patient      Patient will benefit from skilled therapeutic intervention in order to improve the following deficits and impairments:  Pain, Improper body mechanics, Difficulty walking, Decreased strength  Visit Diagnosis: Pain in left hip  Chronic pain of left knee  Muscle weakness (generalized)  Chronic pain of right knee  Difficulty in walking, not elsewhere classified        G-Codes - [id number]    Functional Assessment Tool Used (Outpatient Only) LEFS, clinical presentation, patient interview   Functional Limitation Mobility: Walking and moving around   Mobility: Walking and Moving Around Current Status (([id number] At least 20 percent but less than 40 percent impaired, limited or restricted   Mobility: Walking and Moving Around Goal Status ([phone] At least 1 percent but less than 20 percent impaired, limited or restricted  pt demonstrates potential for improvement      Problem List There are no active problems to display for this patient.   Thank you for your referral.  MJoneen BoersPT, DPT   420-Apr-2018 3:30 PM  [facility] [address] [place] [address]Phone: [phone]  Fax:  [phone] Name: CRayonna HeldmanMRN: [id number] of Birth: [birth year]

## 2016-10-28 ENCOUNTER — Ambulatory Visit: Payer: Medicare Other

## 2016-10-28 DIAGNOSIS — M25552 Pain in left hip: Secondary | ICD-10-CM | POA: Diagnosis not present

## 2016-10-28 DIAGNOSIS — M6281 Muscle weakness (generalized): Secondary | ICD-10-CM

## 2016-10-28 DIAGNOSIS — M25562 Pain in left knee: Principal | ICD-10-CM

## 2016-10-28 DIAGNOSIS — R262 Difficulty in walking, not elsewhere classified: Secondary | ICD-10-CM

## 2016-10-28 DIAGNOSIS — G8929 Other chronic pain: Secondary | ICD-10-CM

## 2016-10-28 DIAGNOSIS — M25561 Pain in right knee: Secondary | ICD-10-CM

## 2016-10-28 NOTE — Therapy (Signed)
[place] [facility] [address] [place], [address] Phone: [phone]   Fax:  [phone]  Physical Therapy Treatment  Patient Details  Name: Shari Wilson MRN: [id number] Date of Birth: 06/22/1941 Referring Provider: Ceasar Lund, MD  Encounter Date: 10/28/2016      PT End of Session - 10/28/16 1424    Visit Number 10   Number of Visits 13   Date for PT Re-Evaluation 11/04/16   Authorization Type 2   Authorization Time Period of 10 gcode   PT Start Time 1424   PT Stop Time 1502   PT Time Calculation (min) 38 min   Activity Tolerance Patient tolerated treatment well   Behavior During Therapy [facility] for tasks assessed/performed      Past Medical History:  Diagnosis Date  . Asthma    allergies/asthma    No past surgical history on file.  There were no vitals filed for this visit.      Subjective Assessment - 10/28/16 1425    Subjective Both knees at the joint ached when she brought her groceries up about 7 steps this morning. Mild bilateral knee ache L > R currently (1/10 bilaterally). Hips are not too bad. Mild ahce at L hip today. Thing were pretty good yesterday.    Pertinent History Arthralgia multiple sites. Pt states sometimes getting a sharp pain in her L hip when walking which disappears quickly. Feels like a nerve gets pinched.  Pt also states getting L knee pain below the knee cap and her shin (feels like a muscle or nerve) and feels like she has no strength there. Symptoms occur sporadic and does not know what causes it. Does not like the surprise occurence of the symptoms which always occurs on the L side.  Pain has been occuring at least 2 years or more. Turning suddenly or twisting on her L LE bothers it.  Has not had imaging for her L hip and knee. No back pain. No bowel or bladder problems or LE paresthsias.  Sitting for an hour causes bilateral knee stiffness.  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 Has to pause after standing and limber her knees up prior to walking.   Pain can happen several times a day but pain can occur as rarely as none for weeks.    Patient Stated Goals Get rid of the pain, sudden loss of power of her leg.    Currently in Pain? Yes   Pain Score 1    Pain Onset More than a month ago                                 PT Education - 10/28/16 1450    Education provided Yes   Education Details ther-ex   [employer]) Educated Patient   Methods Explanation;Demonstration;Tactile cues;Verbal cues   Comprehension Returned demonstration;Verbalized understanding        Objectives    Palpation: good medial and lateral tibial mobility in extension position bilateral knees   Ther-ex  Seated manually resisted L knee flexion targeting the medial hamstrings 10x3. Decreased symptoms Seated manually resisted R knee flexion targetting the medial hamstrings 10x3 Reinforced seated knee flexion exercises with T-band HEP targeting the medial hamstrings. Pt verbalized understanding (pt states that is the  exercise she did not do often)  Supine hip extension isometrics 10 seconds x 10. Pt states legs feeling lighter and smoother when walking afterwards   Prone glute max/quad set 5x3 with 5 second holds each LE  SLS squat at total gym height 89fr 10x2 each LE without opposite LE assist then at height 10 for 10x each LE, then at height 11 for 10x each LE to promote femoral control and LE strengthening.      Improved exercise technique, movement at target joints, use of target muscles after min to mod verbal, visual, tactile cues.    Decreased knee symptoms with activation of medial hamstrings and glute max muscles.        PT Long Term Goals - 10/25/16 1509      PT LONG TERM GOAL #1   Title Patient will have a  decrease in L hip and knee pain to 3/10 or less at worst to promote ability to ambulate and perform functional tasks with less pain.   Baseline 5/10 L hip and 6/10 L knee at worst (09/22/2016); 1/10 bilateral hip and knee pain at most for the past 7 days (10/25/2016)   Time 6   Period Weeks   Status Achieved     PT LONG TERM GOAL #2   Title Patient will improve her LEFS score by at least 9 points as a demonstration of improved function.   Baseline 37/80 (09/22/2016); 64/80 (10/25/2016)   Time 6   Period Weeks   Status Achieved     PT LONG TERM GOAL #3   Title Patient will improve bilateral hip strength by at least 1/2 MMT grade to promote ability to perform standing tasks with less symptoms.    Time 6   Period Weeks   Status Partially Met               Plan - 10/28/16 1450    Clinical Impression Statement Decreased knee symptoms with activation of medial hamstrings and glute max muscles.    Rehab Potential Good   Clinical Impairments Affecting Rehab Potential chronicity of condition   PT Frequency 2x / week   PT Duration 6 weeks   PT Treatment/Interventions Therapeutic exercise;Therapeutic activities;Dry needling;Patient/family education;Manual techniques;Neuromuscular re-education;Balance training;Gait training;Ultrasound;Traction;Iontophoresis 460mml Dexamethasone;Electrical Stimulation;Aquatic Therapy   PT Next Visit Plan manual therapy, hip and knee strengthening   Consulted and Agree with Plan of Care Patient      Patient will benefit from skilled therapeutic intervention in order to improve the following deficits and impairments:  Pain, Improper body mechanics, Difficulty walking, Decreased strength  Visit Diagnosis: Chronic pain of left knee  Pain in left hip  Muscle weakness (generalized)  Chronic pain of right knee  Difficulty in walking, not elsewhere classified     Problem List There are no active problems to display for this patient.  MiJoneen BoersT,  DPT   10/28/2016, 9:12 PM  [facility] [id number]: [phone] Fax:  [phone]: CoAllaina BrotzmanRN: [id number] of Birth: [birth year]

## 2016-11-01 ENCOUNTER — Ambulatory Visit: Payer: Medicare Other

## 2016-11-02 ENCOUNTER — Ambulatory Visit: Payer: Medicare Other

## 2016-11-02 DIAGNOSIS — M6281 Muscle weakness (generalized): Secondary | ICD-10-CM

## 2016-11-02 DIAGNOSIS — R262 Difficulty in walking, not elsewhere classified: Secondary | ICD-10-CM

## 2016-11-02 DIAGNOSIS — M25561 Pain in right knee: Secondary | ICD-10-CM

## 2016-11-02 DIAGNOSIS — G8929 Other chronic pain: Secondary | ICD-10-CM

## 2016-11-02 DIAGNOSIS — M25562 Pain in left knee: Secondary | ICD-10-CM

## 2016-11-02 DIAGNOSIS — M25552 Pain in left hip: Secondary | ICD-10-CM | POA: Diagnosis not present

## 2016-11-02 NOTE — Therapy (Signed)
[place] [facility] [address] [place], [address] Phone: [phone]   Fax:  [phone]  Physical Therapy Treatment  Patient Details  Name: Shari Wilson  MRN: [id number] Date of Birth: [birth year] Referring Provider: Ceasar Lund, MD  Encounter Date: 11/02/2016      PT End of Session - 11/02/16 1434    Visit Number 11   Number of Visits 13   Date for PT Re-Evaluation 11/04/16   Authorization Type 3   Authorization Time Period of 10 gcode   PT Start Time 1434   PT Stop Time 1508   PT Time Calculation (min) 34 min   Activity Tolerance Patient tolerated treatment well   Behavior During Therapy [facility] for tasks assessed/performed      Past Medical History:  Diagnosis Date  . Asthma    allergies/asthma    No past surgical history on file.  There were no vitals filed for this visit.      Subjective Assessment - 11/02/16 1435    Subjective Today the L medial knee is just bothering her. 1/10 currently. Hips are ok now. Had periods yesterday or today when she would have catches in them.  Does not know exactly what caused it. Might have turned the wrong way. Hips can catch when she is just walking across the floor. If she consciously keeps her thighs straight, then the catches do not happen as often. 1/10 bilateral hips yesterday. Nothing has gone over a 1/10 lately.  Pt states that she thinks PT is helping a lot and has given her a lot of insight on what to watch out for and that has helped a lot.  For now, she feels like she can continue with her HEP and graduate after next session. Wants to be able to come back to PT if she needs to.    Pertinent History Arthralgia multiple sites. Pt states sometimes getting a sharp pain in her L hip when walking which disappears quickly. Feels like a nerve gets pinched.  Pt also states getting L knee pain below the knee cap and her shin (feels like a muscle or nerve) and  feels like she has no strength there. Symptoms occur sporadic and does not know what causes it. Does not like the surprise occurence of the symptoms which always occurs on the L side.  Pain has been occuring at least 2 years or more. Turning suddenly or twisting on her L LE bothers it.  Has not had imaging for her L hip and knee. No back pain. No bowel or bladder problems or LE paresthsias.  Sitting for an hour causes bilateral knee stiffness.  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 Has to pause after standing and limber her knees up prior to walking.   Pain can happen several times a day but pain can occur as rarely as none for weeks.    Patient Stated Goals Get rid of the pain, sudden loss of power of her leg.    Currently in Pain? Yes   Pain Score 1    Pain Onset More than a month ago                                 PT Education - 11/02/16 1447  Education provided Yes   Education Details ther-ex, plan of care   Person(s) Educated Patient   Methods Explanation;Demonstration;Tactile cues;Verbal cues   Comprehension Verbalized understanding;Returned demonstration        Objectives    Ther-ex  Pt education on trying HEP for the next 2-3 months following graduation from PT. Pt can get another PT referral from MD if she feels like she needs more sessions. Pt demonstrated and verbalized understanding.    Directed patient with seated L knee flexion resisting yellow band targeting medial hamstrings 10x  Prone glute max/quad set 5x3 with 5 second holds each LE  SLS squat at total gym height 12 for 10x2 each LE, emphasis on femoral control  Slight L medial knee soreness  Seated manually resisted knee flexion targeting the medial hamstrings for L LE 10x3  SLS with opposite tip toe assist, emphasis on level pelvis to promote glute med  strengthening. 10x2 with 5 second holds. Good glute med muscle use felt.   Gait around gym 100 ft x 2   Improved exercise technique, movement at target joints, use of target muscles after min tomod verbal, visual, tactile cues.           PT Long Term Goals - 10/25/16 1509      PT LONG TERM GOAL #1   Title Patient will have a decrease in L hip and knee pain to 3/10 or less at worst to promote ability to ambulate and perform functional tasks with less pain.   Baseline 5/10 L hip and 6/10 L knee at worst (09/22/2016); 1/10 bilateral hip and knee pain at most for the past 7 days (10/25/2016)   Time 6   Period Weeks   Status Achieved     PT LONG TERM GOAL #2   Title Patient will improve her LEFS score by at least 9 points as a demonstration of improved function.   Baseline 37/80 (09/22/2016); 64/80 (10/25/2016)   Time 6   Period Weeks   Status Achieved     PT LONG TERM GOAL #3   Title Patient will improve bilateral hip strength by at least 1/2 MMT grade to promote ability to perform standing tasks with less symptoms.    Time 6   Period Weeks   Status Partially Met               Plan - 11/02/16 1433    Clinical Impression Statement Decreased L medial knee pain with exercises promoting medial hamstring activation. No bilateral hip catching or complain of knee pain with walking around the gym 200 ft after session. Pt making very good progress with therapy with being able to perform functional activities with less bilateral hip and knee pain.  Improving bilateral femoral control. Added SLS on LE with contralateral tip toe assist with emphasis on pelvic control to promote glute med strengthening.    Rehab Potential Good   Clinical Impairments Affecting Rehab Potential chronicity of condition   PT Frequency 2x / week   PT Duration 6 weeks   PT Treatment/Interventions Therapeutic exercise;Therapeutic activities;Dry needling;Patient/family education;Manual techniques;Neuromuscular  re-education;Balance training;Gait training;Ultrasound;Traction;Iontophoresis 54m/ml Dexamethasone;Electrical Stimulation;Aquatic Therapy   PT Next Visit Plan manual therapy, hip and knee strengthening   Consulted and Agree with Plan of Care Patient      Patient will benefit from skilled therapeutic intervention in order to improve the following deficits and impairments:  Pain, Improper body mechanics, Difficulty walking, Decreased strength  Visit Diagnosis: Pain in left hip  Chronic pain of  left knee  Muscle weakness (generalized)  Chronic pain of right knee  Difficulty in walking, not elsewhere classified     Problem List There are no active problems to display for this patient.   Joneen Boers PT, DPT   11/02/2016, 3:18 PM  [place] [facility] [address], [place], [address] Phone: [phone]   Fax:  [phone]  Name: Zema Lizardo MRN: [id number] Date of Birth: [birth year]

## 2016-11-04 ENCOUNTER — Ambulatory Visit: Payer: Medicare Other

## 2016-11-04 DIAGNOSIS — M25552 Pain in left hip: Secondary | ICD-10-CM

## 2016-11-04 DIAGNOSIS — G8929 Other chronic pain: Secondary | ICD-10-CM

## 2016-11-04 DIAGNOSIS — M25562 Pain in left knee: Secondary | ICD-10-CM

## 2016-11-04 DIAGNOSIS — M25561 Pain in right knee: Secondary | ICD-10-CM

## 2016-11-04 DIAGNOSIS — R262 Difficulty in walking, not elsewhere classified: Secondary | ICD-10-CM

## 2016-11-04 DIAGNOSIS — M6281 Muscle weakness (generalized): Secondary | ICD-10-CM

## 2016-11-04 NOTE — Therapy (Signed)
[facility] [address] [place], [address] Phone: [phone]   Fax:  [phone]  Physical Therapy Treatment And Discharge Summary  Patient Details  Name: Shari Wilson MRN: [id number] Date of Birth: 18-Jun-1941 Referring Provider: Ceasar Lund, MD  Encounter Date: 11/04/2016      PT End of Session - 11/04/16 1418    Visit Number 12   Number of Visits 13   Date for PT Re-Evaluation 11/04/16   Authorization Type 4   Authorization Time Period of 10 gcode   PT Start Time 1419   PT Stop Time 1503   PT Time Calculation (min) 44 min   Activity Tolerance Patient tolerated treatment well   Behavior During Therapy [facility] for tasks assessed/performed      Past Medical History:  Diagnosis Date  . Asthma    allergies/asthma    No past surgical history on file.  There were no vitals filed for this visit.      Subjective Assessment - 11/04/16 1420    Subjective Today, hips and knees are pretty good. No pain. Yesterday had twinges in her hips when she was twisting and turning to do things when not paying attention. Other than that, she is fine.      Pertinent History Arthralgia multiple sites. Pt states sometimes getting a sharp pain in her L hip when walking which disappears quickly. Feels like a nerve gets pinched.  Pt also states getting L knee pain below the knee cap and her shin (feels like a muscle or nerve) and feels like she has no strength there. Symptoms occur sporadic and does not know what causes it. Does not like the surprise occurence of the symptoms which always occurs on the L side.  Pain has been occuring at least 2 years or more. Turning suddenly or twisting on her L LE bothers it.  Has not had imaging for her L hip and knee. No back pain. No bowel or bladder problems or LE paresthsias.  Sitting for an hour causes bilateral knee stiffness.  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 Has to pause after standing and limber her knees up prior to walking.   Pain can happen several times a day but pain can occur as rarely as none for weeks.    Patient Stated Goals Get rid of the pain, sudden loss of power of her leg.    Currently in Pain? No/denies   Pain Score 0-No pain   Pain Onset More than a month ago            [facility] PT Assessment - 11/04/16 1423      Strength   Right Hip Flexion 4+/5   Right Hip Extension 3+/5   Right Hip External Rotation  4+/5   Right Hip ABduction 4+/5   Left Hip Flexion 5/5   Left Hip Extension 3+/5   Left Hip External Rotation 4+/5   Left Hip ABduction 4+/5                             PT Education - 11/04/16 1445    Education provided Yes   Education Details ther-ex   Person(s) Educated Patient   Methods Explanation;Demonstration;Tactile cues;Verbal cues   Comprehension Returned demonstration;Verbalized understanding  Objectives   Going up and down steps is easier  There-ex  Seated manually resisted hip flexion, hip ER; S/L hip abduction, prone glute max extension 1-2x each way for each LE  Reviewed progress/current status with LE strength with pt  SLS squat at total gym height 12 for 10x2 each LE, emphasis on femoral control           S/L hip abduction 5x 2 each LE  Prone glute max/quad set 10x2 with 5 second holds each LE  Supine hip extension isometrics with leg straight, pillow under straight LE 10x5 seconds for each LE. Reviewed and given as part of her HEP 10x5 seconds for 3 sessions daily for each LE. Handout provided. Pt demonstrated and verbalized understanding.   Reviewed previous HEP. Pt verbalized understanding.     Seated manually resisted knee flexion targeting the medial hamstrings for L LE 10x3  Pt was recommended to continue working on her HEP for the next few  months and if she feels like she needs more PT, she can get a referral from her MD again and PT can resume. Pt verbalized understanding.    Improved exercise technique, movement at target joints, use of target muscles after min to mod verbal, visual, tactile cues.     Pt has demonstrated overall improved bilateral hip strength, decreased bilateral hip and knee pain,  improved bilateral femoral control and improved function since initial evaluation. Patient has progressed very well with physical therapy towards goals. Skilled physical therapy services discharged with patient continuing progress with her home exercise program.        PT Long Term Goals - 11/04/16 1511      PT LONG TERM GOAL #1   Title Patient will have a decrease in L hip and knee pain to 3/10 or less at worst to promote ability to ambulate and perform functional tasks with less pain.   Baseline 5/10 L hip and 6/10 L knee at worst (09/22/2016); 1/10 bilateral hip and knee pain at most for the past 7 days (10/25/2016)   Time 6   Period Weeks   Status Achieved     PT LONG TERM GOAL #2   Title Patient will improve her LEFS score by at least 9 points as a demonstration of improved function.   Baseline 37/80 (09/22/2016); 64/80 (10/25/2016)   Time 6   Period Weeks   Status Achieved     PT LONG TERM GOAL #3   Title Patient will improve bilateral hip strength by at least 1/2 MMT grade to promote ability to perform standing tasks with less symptoms.    Time 6   Period Weeks   Status Partially Met               Plan - 11/04/16 1503    Clinical Impression Statement Pt has demonstrated overall improved bilateral hip strength, decreased bilateral hip and knee pain,  improved bilateral femoral control and improved function since initial evaluation. Patient has progressed very well with physical therapy towards goals. Skilled physical therapy services discharged with patient continuing progress with her home exercise program.     Rehab Potential Good   Clinical Impairments Affecting Rehab Potential chronicity of condition   PT Frequency --   PT Duration --   PT Treatment/Interventions Therapeutic exercise;Therapeutic activities;Patient/family education;Manual techniques;Neuromuscular re-education   PT Next Visit Plan Continue progress with HEP   Consulted and Agree with Plan of Care Patient      Patient will benefit from skilled  therapeutic intervention in order to improve the following deficits and impairments:  Pain, Improper body mechanics, Difficulty walking, Decreased strength  Visit Diagnosis: Pain in left hip  Chronic pain of left knee  Muscle weakness (generalized)  Chronic pain of right knee  Difficulty in walking, not elsewhere classified       G-Codes - 05-Nov-2016 1518    Functional Assessment Tool Used (Outpatient Only) LEFS, clinical presentation, patient interview, clinical judgement   Functional Limitation Mobility: Walking and moving around   Mobility: Walking and Moving Around Goal Status [phone]) At least 1 percent but less than 20 percent impaired, limited or restricted   Mobility: Walking and Moving Around Discharge Status [phone]) At least 1 percent but less than 20 percent impaired, limited or restricted      Problem List There are no active problems to display for this patient.  Thank you for your referral.   Joneen Boers PT, DPT   Nov 05, 2016, 3:23 PM  [facility] [address] [place], [address] Phone: [phone]   Fax:  [phone]  Name: Shari Wilson MRN: [id number] Date of Birth: November 13, 1940

## 2020-04-07 ENCOUNTER — Ambulatory Visit (INDEPENDENT_AMBULATORY_CARE_PROVIDER_SITE_OTHER): Payer: Medicare Other | Admitting: Dermatology

## 2020-04-07 ENCOUNTER — Other Ambulatory Visit: Payer: Self-pay

## 2020-04-07 DIAGNOSIS — L821 Other seborrheic keratosis: Secondary | ICD-10-CM

## 2020-04-07 DIAGNOSIS — L719 Rosacea, unspecified: Secondary | ICD-10-CM | POA: Diagnosis not present

## 2020-04-07 DIAGNOSIS — L219 Seborrheic dermatitis, unspecified: Secondary | ICD-10-CM

## 2020-04-07 DIAGNOSIS — L570 Actinic keratosis: Secondary | ICD-10-CM

## 2020-04-07 MED ORDER — METRONIDAZOLE 0.75 % EX CREA: TOPICAL_CREAM | CUTANEOUS | 11 refills | Status: DC

## 2020-04-07 NOTE — Patient Instructions (Signed)
Cryotherapy Aftercare  . Wash gently with soap and water everyday.   . Apply Vaseline and Band-Aid daily until healed.  

## 2020-04-07 NOTE — Progress Notes (Signed)
   Follow-Up Visit   Subjective  Shari Wilson is a 79 y.o. female who presents for the following: Rosacea (1 year follow-up. Patient is controlled using metronidazole ceam once daily.). She has noticed a grainy texture to her face within the past several months.  The following portions of the chart were reviewed this encounter and updated as appropriate:      Review of Systems:  No other skin or systemic complaints except as noted in HPI or Assessment and Plan.  Objective  Well appearing patient in no apparent distress; mood and affect are within normal limits.  A focused examination was performed including face. Relevant physical exam findings are noted in the Assessment and Plan.  Objective  Face: Erythema with telangiectasias nose and malar cheeks.  Objective  Right Dorsum Nose x 1: Erythematous thin papules/macules with gritty scale.   Objective  Scalp: Pink scaly patches.  She does tend to pick at these spots   Assessment & Plan    Seborrheic Keratoses - Stuck-on, waxy, tan-brown macules/patches face - Discussed benign etiology and prognosis. Samples of Eucerin Roughness Relief and CeraVe SA Cream. Apply to face at night. - Observe - Call for any changes   Rosacea Face  Controlled on current treatment. Continue metronidazole 0.75% cream Apply to face QD dsp 45g 68yr Rf. Recommend daily broad spectrum sunscreen SPF 30+ to sun-exposed areas, reapply every 2 hours as needed.   AK (actinic keratosis) Right Dorsum Nose x 1  vs ISK  Destruction of lesion - Right Dorsum Nose x 1  Destruction method: cryotherapy   Informed consent: discussed and consent obtained   Lesion destroyed using liquid nitrogen: Yes   Region frozen until ice ball extended beyond lesion: Yes   Outcome: patient tolerated procedure well with no complications   Post-procedure details: wound care instructions given    Seborrheic dermatitis Scalp  Avoid scratching/picking. Sample of  Head & Shoulders given. Apply to scalp and let sit several minutes before rinsing qod. Continue fluocinonide solution to AAs scalp prn itch. Patient will call for refills.   Return in about 1 year (around 04/07/2021) for Rosacea.   IJamesetta Orleans, CMA, am acting as scribe for Brendolyn Patty, MD .  Documentation: I have reviewed the above documentation for accuracy and completeness, and I agree with the above.  Brendolyn Patty MD

## 2021-04-13 ENCOUNTER — Other Ambulatory Visit: Payer: Self-pay

## 2021-04-13 ENCOUNTER — Ambulatory Visit (INDEPENDENT_AMBULATORY_CARE_PROVIDER_SITE_OTHER): Payer: Medicare Other | Admitting: Dermatology

## 2021-04-13 DIAGNOSIS — L820 Inflamed seborrheic keratosis: Secondary | ICD-10-CM | POA: Diagnosis not present

## 2021-04-13 DIAGNOSIS — D492 Neoplasm of unspecified behavior of bone, soft tissue, and skin: Secondary | ICD-10-CM

## 2021-04-13 DIAGNOSIS — L722 Steatocystoma multiplex: Secondary | ICD-10-CM | POA: Diagnosis not present

## 2021-04-13 DIAGNOSIS — L719 Rosacea, unspecified: Secondary | ICD-10-CM | POA: Diagnosis not present

## 2021-04-13 MED ORDER — METRONIDAZOLE 0.75 % EX CREA: TOPICAL_CREAM | CUTANEOUS | 11 refills | Status: AC

## 2021-04-13 NOTE — Patient Instructions (Addendum)
If you have any questions or concerns for your doctor, please call our main line at [phone] and press option 4 to reach your doctor's medical assistant. If no one answers, please leave a voicemail as directed and we will return your call as soon as possible. Messages left after 4 pm will be answered the following business day.   You may also send us a message via MyChart. We typically respond to MyChart messages within 1-2 business days.  For prescription refills, please ask your pharmacy to contact our office. Our fax number is [phone].  If you have an urgent issue when the clinic is closed that cannot wait until the next business day, you can page your doctor at the number below.    Please note that while we do our best to be available for urgent issues outside of office hours, we are not available 24/7.   If you have an urgent issue and are unable to reach us, you may choose to seek medical care at your doctor's office, retail clinic, urgent care center, or emergency room.  If you have a medical emergency, please immediately call 911 or go to the emergency department.  Pager Numbers  - Dr. Kowalski: [phone]  - Dr. Moye: [phone]  - Dr. Stewart: [phone]  In the event of inclement weather, please call our main line at [phone] for an update on the status of any delays or closures.  Dermatology Medication Tips: Please keep the boxes that topical medications come in in order to help keep track of the instructions about where and how to use these. Pharmacies typically print the medication instructions only on the boxes and not directly on the medication tubes.   If your medication is too expensive, please contact our office at [phone] option 4 or send us a message through MyChart.   We are unable to tell what your co-pay for medications will be in advance as this is different depending on your insurance coverage. However, we may be able to find a substitute  medication at lower cost or fill out paperwork to get insurance to cover a needed medication.   If a prior authorization is required to get your medication covered by your insurance company, please allow us 1-2 business days to complete this process.  Drug prices often vary depending on where the prescription is filled and some pharmacies may offer cheaper prices.  The website www.goodrx.com contains coupons for medications through different pharmacies. The prices here do not account for what the cost may be with help from insurance (it may be cheaper with your insurance), but the website can give you the price if you did not use any insurance.  - You can print the associated coupon and take it with your prescription to the pharmacy.  - You may also stop by our office during regular business hours and pick up a GoodRx coupon card.  - If you need your prescription sent electronically to a different pharmacy, notify our office through [facility] MyChart or by phone at [phone] option 4.   Wound Care Instructions  Cleanse wound gently with soap and water once a day then pat dry with clean gauze. Apply a thing coat of Petrolatum (petroleum jelly, "Vaseline") over the wound (unless you have an allergy to this). We recommend that you use a new, sterile tube of Vaseline. Do not pick or remove scabs. Do not remove the yellow or white "healing tissue" from the base of the wound.  Cover the   wound with fresh, clean, nonstick gauze and secure with paper tape. You may use Band-Aids in place of gauze and tape if the would is small enough, but would recommend trimming much of the tape off as there is often too much. Sometimes Band-Aids can irritate the skin.  You should call the office for your biopsy report after 1 week if you have not already been contacted.  If you experience any problems, such as abnormal amounts of bleeding, swelling, significant bruising, significant pain, or evidence of infection,  please call the office immediately.  FOR ADULT SURGERY PATIENTS: If you need something for pain relief you may take 1 extra strength Tylenol (acetaminophen) AND 2 Ibuprofen (200mg each) together every 4 hours as needed for pain. (do not take these if you are allergic to them or if you have a reason you should not take them.) Typically, you may only need pain medication for 1 to 3 days.    

## 2021-04-13 NOTE — Progress Notes (Signed)
   Follow-Up Visit   Subjective  Shari Wilson is a 80 y.o. female who presents for the following: Rosacea (Face, 1 yr f/u, metronidazole 0.75% qhs) and check spot (R wrist, ~1wk, irritating/Chest, rough spot).   The following portions of the chart were reviewed this encounter and updated as appropriate:       Review of Systems:  No other skin or systemic complaints except as noted in HPI or Assessment and Plan.  Objective  Well appearing patient in no apparent distress; mood and affect are within normal limits.  A focused examination was performed including face, neck, R arm. Relevant physical exam findings are noted in the Assessment and Plan.  face Mild erythema with telangiectasias malar cheeks, nose  L upper paranasal/medial canthus 6.48m pink firm pap     R distal forearm x 1, L sternal notch x 1, Total = 2 (2) Erythematous keratotic or waxy stuck-on papule   Assessment & Plan  Rosacea face  Controlled  Rosacea is a chronic progressive skin condition usually affecting the face of adults, causing redness and/or acne bumps. It is treatable but not curable. It sometimes affects the eyes (ocular rosacea) as well. It may respond to topical and/or systemic medication and can flare with stress, sun exposure, alcohol, exercise and some foods.  Daily application of broad spectrum spf 30+ sunscreen to face is recommended to reduce flares.  Cont Metronidazole 0.75% cream qhs  metroNIDAZOLE (METROCREAM) 0.75 % cream - face Apply topically as directed. Qd to bid to face for rosacea  Neoplasm of skin L upper paranasal/medial canthus  Epidermal / dermal shaving  Lesion diameter (cm):  0.6 Informed consent: discussed and consent obtained   Patient was prepped and draped in usual sterile fashion: area prepped with alcohol. Anesthesia: the lesion was anesthetized in a standard fashion   Anesthetic:  1% lidocaine w/ epinephrine 1-100,000 buffered w/ 8.4% NaHCO3 Instrument  used: flexible razor blade   Hemostasis achieved with: pressure, aluminum chloride and electrodesiccation   Outcome: patient tolerated procedure well   Post-procedure details: wound care instructions given   Post-procedure details comment:  Ointment and small bandage applied  Specimen 1 - Surgical pathology Differential Diagnosis: D48.5 Cyst r/o BCC  Check Margins: No 6.057mpink firm pap  Cyst r/o BCC, discussed if BCC will need MOHs and [facility] Inflamed seborrheic keratosis R distal forearm x 1, L sternal notch x 1, Total = 2  Destruction of lesion - R distal forearm x 1, L sternal notch x 1, Total = 2  Destruction method: cryotherapy   Informed consent: discussed and consent obtained   Lesion destroyed using liquid nitrogen: Yes   Region frozen until ice ball extended beyond lesion: Yes   Outcome: patient tolerated procedure well with no complications   Post-procedure details: wound care instructions given   Additional details:  Prior to procedure, discussed risks of blister formation, small wound, skin dyspigmentation, or rare scar following cryotherapy. Recommend Vaseline ointment to treated areas while healing.   Return in about 1 year (around 04/13/2022) for Rosacea.  I, SoOthelia PullingRMA, am acting as scribe for TaBrendolyn PattyMD .  Documentation: I have reviewed the above documentation for accuracy and completeness, and I agree with the above.  TaBrendolyn PattyD

## 2021-04-20 ENCOUNTER — Telehealth: Payer: Self-pay

## 2021-04-20 NOTE — Telephone Encounter (Signed)
Left pt msg to call for bx result/sh °

## 2021-04-20 NOTE — Telephone Encounter (Signed)
-----   Message from Brendolyn Patty, MD sent at 04/20/2021  8:47 AM EDT ----- Skin , left upper paranasal/media canthus STEATOCYSTOMA, BASE INVOLVED  Benign type of cyst, no further treatment needed unless it recurs and is bothersome  - please call patient

## 2021-04-23 ENCOUNTER — Telehealth: Payer: Self-pay

## 2021-04-23 NOTE — Telephone Encounter (Signed)
Lft pt msg to call for bx result./sh 

## 2021-04-23 NOTE — Telephone Encounter (Signed)
-----   Message from Brendolyn Patty, MD sent at 04/20/2021  8:47 AM EDT ----- Skin , left upper paranasal/media canthus STEATOCYSTOMA, BASE INVOLVED  Benign type of cyst, no further treatment needed unless it recurs and is bothersome  - please call patient

## 2021-04-24 ENCOUNTER — Telehealth: Payer: Self-pay

## 2021-04-24 NOTE — Telephone Encounter (Signed)
Patient informed of pathology results 

## 2021-04-27 ENCOUNTER — Telehealth: Payer: Self-pay

## 2021-04-27 NOTE — Telephone Encounter (Signed)
Advised patient biopsy was benign and no further treatment needed.  

## 2022-04-20 ENCOUNTER — Ambulatory Visit: Payer: Medicare Other | Admitting: Dermatology

## 2022-06-22 ENCOUNTER — Ambulatory Visit (INDEPENDENT_AMBULATORY_CARE_PROVIDER_SITE_OTHER): Payer: Medicare Other | Admitting: Dermatology

## 2022-06-22 VITALS — BP 135/69

## 2022-06-22 DIAGNOSIS — L719 Rosacea, unspecified: Secondary | ICD-10-CM

## 2022-06-22 DIAGNOSIS — L820 Inflamed seborrheic keratosis: Secondary | ICD-10-CM | POA: Diagnosis not present

## 2022-06-22 MED ORDER — METRONIDAZOLE 0.75 % EX CREA: TOPICAL_CREAM | CUTANEOUS | 11 refills | Status: DC

## 2022-06-22 NOTE — Patient Instructions (Addendum)
Cryotherapy Aftercare  Wash gently with soap and water everyday.   Apply Vaseline and Band-Aid daily until healed.     Due to recent changes in healthcare laws, you may see results of your pathology and/or laboratory studies on MyChart before the doctors have had a chance to review them. We understand that in some cases there may be results that are confusing or concerning to you. Please understand that not all results are received at the same time and often the doctors may need to interpret multiple results in order to provide you with the best plan of care or course of treatment. Therefore, we ask that you please give us 2 business days to thoroughly review all your results before contacting the office for clarification. Should we see a critical lab result, you will be contacted sooner.   If You Need Anything After Your Visit  If you have any questions or concerns for your doctor, please call our main line at [phone] and press option 4 to reach your doctor's medical assistant. If no one answers, please leave a voicemail as directed and we will return your call as soon as possible. Messages left after 4 pm will be answered the following business day.   You may also send us a message via MyChart. We typically respond to MyChart messages within 1-2 business days.  For prescription refills, please ask your pharmacy to contact our office. Our fax number is [phone].  If you have an urgent issue when the clinic is closed that cannot wait until the next business day, you can page your doctor at the number below.    Please note that while we do our best to be available for urgent issues outside of office hours, we are not available 24/7.   If you have an urgent issue and are unable to reach us, you may choose to seek medical care at your doctor's office, retail clinic, urgent care center, or emergency room.  If you have a medical emergency, please immediately call 911 or go to the  emergency department.  Pager Numbers  - Dr. Kowalski: [phone]  - Dr. Moye: [phone]  - Dr. Stewart: [phone]  In the event of inclement weather, please call our main line at [phone] for an update on the status of any delays or closures.  Dermatology Medication Tips: Please keep the boxes that topical medications come in in order to help keep track of the instructions about where and how to use these. Pharmacies typically print the medication instructions only on the boxes and not directly on the medication tubes.   If your medication is too expensive, please contact our office at [phone] option 4 or send us a message through MyChart.   We are unable to tell what your co-pay for medications will be in advance as this is different depending on your insurance coverage. However, we may be able to find a substitute medication at lower cost or fill out paperwork to get insurance to cover a needed medication.   If a prior authorization is required to get your medication covered by your insurance company, please allow us 1-2 business days to complete this process.  Drug prices often vary depending on where the prescription is filled and some pharmacies may offer cheaper prices.  The website www.goodrx.com contains coupons for medications through different pharmacies. The prices here do not account for what the cost may be with help from insurance (it may be cheaper with your insurance), but the website can   give you the price if you did not use any insurance.  - You can print the associated coupon and take it with your prescription to the pharmacy.  - You may also stop by our office during regular business hours and pick up a GoodRx coupon card.  - If you need your prescription sent electronically to a different pharmacy, notify our office through [facility] MyChart or by phone at [phone] option 4.     Si Usted Necesita Algo Despus de Su Visita  Tambin puede  enviarnos un mensaje a travs de MyChart. Por lo general respondemos a los mensajes de MyChart en el transcurso de 1 a 2 das hbiles.  Para renovar recetas, por favor pida a su farmacia que se ponga en contacto con nuestra oficina. Nuestro nmero de fax es el [phone].  Si tiene un asunto urgente cuando la clnica est cerrada y que no puede esperar hasta el siguiente da hbil, puede llamar/localizar a su doctor(a) al nmero que aparece a continuacin.   Por favor, tenga en cuenta que aunque hacemos todo lo posible para estar disponibles para asuntos urgentes fuera del horario de oficina, no estamos disponibles las 24 horas del da, los 7 das de la semana.   Si tiene un problema urgente y no puede comunicarse con nosotros, puede optar por buscar atencin mdica  en el consultorio de su doctor(a), en una clnica privada, en un centro de atencin urgente o en una sala de emergencias.  Si tiene una emergencia mdica, por favor llame inmediatamente al 911 o vaya a la sala de emergencias.  Nmeros de bper  - Dr. Kowalski: [phone]  - Dra. Moye: [phone]  - Dra. Stewart: [phone]  En caso de inclemencias del tiempo, por favor llame a nuestra lnea principal al [phone] para una actualizacin sobre el estado de cualquier retraso o cierre.  Consejos para la medicacin en dermatologa: Por favor, guarde las cajas en las que vienen los medicamentos de uso tpico para ayudarle a seguir las instrucciones sobre dnde y cmo usarlos. Las farmacias generalmente imprimen las instrucciones del medicamento slo en las cajas y no directamente en los tubos del medicamento.   Si su medicamento es muy caro, por favor, pngase en contacto con nuestra oficina llamando al [phone] y presione la opcin 4 o envenos un mensaje a travs de MyChart.   No podemos decirle cul ser su copago por los medicamentos por adelantado ya que esto es diferente dependiendo de la cobertura de su seguro.  Sin embargo, es posible que podamos encontrar un medicamento sustituto a menor costo o llenar un formulario para que el seguro cubra el medicamento que se considera necesario.   Si se requiere una autorizacin previa para que su compaa de seguros cubra su medicamento, por favor permtanos de 1 a 2 das hbiles para completar este proceso.  Los precios de los medicamentos varan con frecuencia dependiendo del lugar de dnde se surte la receta y alguna farmacias pueden ofrecer precios ms baratos.  El sitio web www.goodrx.com tiene cupones para medicamentos de diferentes farmacias. Los precios aqu no tienen en cuenta lo que podra costar con la ayuda del seguro (puede ser ms barato con su seguro), pero el sitio web puede darle el precio si no utiliz ningn seguro.  - Puede imprimir el cupn correspondiente y llevarlo con su receta a la farmacia.  - Tambin puede pasar por nuestra oficina durante el horario de atencin regular y recoger una tarjeta de cupones de GoodRx.  -   Si necesita que su receta se enve electrnicamente a una farmacia diferente, informe a nuestra oficina a travs de MyChart de [facility] o por telfono llamando al [phone] y presione la opcin 4.  

## 2022-06-22 NOTE — Progress Notes (Signed)
   Follow-Up Visit   Subjective  Shari Wilson is a 81 y.o. female who presents for the following: Rosacea (1 year follow-up. Improved with metronidazole 0.75% cream.). She also has a growth on her right medial calf present over a year. No symptoms, but picks at.    The following portions of the chart were reviewed this encounter and updated as appropriate:       Review of Systems:  No other skin or systemic complaints except as noted in HPI or Assessment and Plan.  Objective  Well appearing patient in no apparent distress; mood and affect are within normal limits.  A focused examination was performed including face, right lower leg. Relevant physical exam findings are noted in the Assessment and Plan.  face Mild erythema with telangiectasias of the malar cheeks, nose.  right medial calf Firm pink waxy keratotic nodule, 1.0 CM    Assessment & Plan  Rosacea face  Chronic condition with duration or expected duration over one year. Currently well-controlled on treatment.  Rosacea is a chronic progressive skin condition usually affecting the face of adults, causing redness and/or acne bumps. It is treatable but not curable. It sometimes affects the eyes (ocular rosacea) as well. It may respond to topical and/or systemic medication and can flare with stress, sun exposure, alcohol, exercise, topical steroids (including hydrocortisone/cortisone 10) and some foods.  Daily application of broad spectrum spf 30+ sunscreen to face is recommended to reduce flares.  Continue metronidazole 0.75% cream Apply to face QD/BID for rosacea dsp 45g 1 yr Rf.  metroNIDAZOLE (METROCREAM) 0.75 % cream - face Apply to face once to twice daily for rosacea.  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  Patient picks at.  Avoid picking while healing.   Discussed biopsy if recurs.  Destruction of lesion - right medial calf  Destruction method: cryotherapy   Informed  consent: discussed and consent obtained   Lesion destroyed using liquid nitrogen: Yes   Region frozen until ice ball extended beyond lesion: Yes   Outcome: patient tolerated procedure well with no complications   Post-procedure details: wound care instructions given   Additional details:  Prior to procedure, discussed risks of blister formation, small wound, skin dyspigmentation, or rare scar following cryotherapy. Recommend Vaseline ointment to treated areas while healing.    Return in about 1 year (around 06/23/2023) for Rosacea.   Documentation: I have reviewed the above documentation for accuracy and completeness, and I agree with the above.  Brendolyn Patty MD

## 2023-06-20 ENCOUNTER — Ambulatory Visit: Payer: Medicare Other | Admitting: Dermatology

## 2023-11-05 ENCOUNTER — Other Ambulatory Visit: Payer: Self-pay | Admitting: Dermatology

## 2023-11-05 DIAGNOSIS — L719 Rosacea, unspecified: Secondary | ICD-10-CM

## 2023-12-07 ENCOUNTER — Ambulatory Visit: Admitting: Dermatology

## 2023-12-07 DIAGNOSIS — L821 Other seborrheic keratosis: Secondary | ICD-10-CM

## 2023-12-07 DIAGNOSIS — L719 Rosacea, unspecified: Secondary | ICD-10-CM | POA: Diagnosis not present

## 2023-12-07 DIAGNOSIS — L814 Other melanin hyperpigmentation: Secondary | ICD-10-CM

## 2023-12-07 DIAGNOSIS — D229 Melanocytic nevi, unspecified: Secondary | ICD-10-CM | POA: Diagnosis not present

## 2023-12-07 MED ORDER — METRONIDAZOLE 0.75 % EX CREA: TOPICAL_CREAM | CUTANEOUS | 11 refills | Status: AC

## 2023-12-07 NOTE — Progress Notes (Signed)
   Follow-Up Visit   Subjective  Shari Wilson is a 83 y.o. female who presents for the following: Rosacea of the face, controlled with metronidazole  0.75% cream once daily. She also has rough spots on her left arm that gets irritated at times.    The following portions of the chart were reviewed this encounter and updated as appropriate: medications, allergies, medical history  Review of Systems:  No other skin or systemic complaints except as noted in HPI or Assessment and Plan.  Objective  Well appearing patient in no apparent distress; mood and affect are within normal limits.  Areas Examined: face  Relevant physical exam findings are noted in the Assessment and Plan.    Assessment & Plan   ROSACEA   Related Medications metroNIDAZOLE  (METROCREAM ) 0.75 % cream Apply to face every night for rosacea, increase to twice daily for flares.  ROSACEA  Exam: Mild erythema with telangiectasias at malar cheeks, nasolabial folds, nose.   Chronic condition with duration or expected duration over one year. Currently well-controlled.  Rosacea is a chronic progressive skin condition usually affecting the face of adults, causing redness and/or acne bumps. It is treatable but not curable. It sometimes affects the eyes (ocular rosacea) as well. It may respond to topical and/or systemic medication and can flare with stress, sun exposure, alcohol, exercise, topical steroids (including hydrocortisone/cortisone 10) and some foods.  Daily application of broad spectrum spf 30+ sunscreen to face is recommended to reduce flares.  Treatment Plan Continue metronidazole  0.75% cream apply to face once a day, twice daily for flares dsp 45g 1 yr Rf.    SEBORRHEIC KERATOSIS, some inflamed - Stuck-on, waxy, tan-brown papules, face and arms.  Waxy tan patches on arms, some with erythema.  - Benign-appearing - Discussed benign etiology and prognosis. - Observe - Call for any changes - Continue Gold  Bond Rough & Bumpy - Start otc 1% hydrocortisone cream as needed for irritation/itch.  - Discussed cryotherapy if symptomatic   LENTIGINES Exam: scattered tan macules face Due to sun exposure Treatment Plan: Benign-appearing, observe. Recommend daily broad spectrum sunscreen SPF 30+ to sun-exposed areas, reapply every 2 hours as needed.  Call for any changes  MELANOCYTIC NEVI Exam: Tan-brown and/or pink-flesh-colored symmetric macules and papules face  Treatment Plan: Benign appearing on exam today. Recommend observation. Call clinic for new or changing moles. Recommend daily use of broad spectrum spf 30+ sunscreen to sun-exposed areas.    Return in about 1 year (around 12/06/2024) for Rosacea.  IBernardine Bridegroom, CMA, am acting as scribe for Artemio Larry, MD .   Documentation: I have reviewed the above documentation for accuracy and completeness, and I agree with the above.  Artemio Larry, MD

## 2023-12-07 NOTE — Patient Instructions (Addendum)
 Metronidazole  0.75% cream - apply to face every night for rosacea, increase to twice daily with flares.    Rosacea  What is rosacea? Rosacea (say: ro-zay-sha) is a common skin disease that usually begins as a trend of flushing or blushing easily.  As rosacea progresses, a persistent redness in the center of the face will develop and may gradually spread beyond the nose and cheeks to the forehead and chin.  In some cases, the ears, chest, and back could be affected.  Rosacea may appear as tiny blood vessels or small red bumps that occur in crops.  Frequently they can contain pus, and are called "pustules".  If the bumps do not contain pus, they are referred to as "papules".  Rarely, in prolonged, untreated cases of rosacea, the oil glands of the nose and cheeks may become permanently enlarged.  This is called rhinophyma, and is seen more frequently in men.  Signs and Risks In its beginning stages, rosacea tends to come and go, which makes it difficult to recognize.  It can start as intermittent flushing of the face.  Eventually, blood vessels may become permanently visible.  Pustules and papules can appear, but can be mistaken for adult acne.  People of all races, ages, genders and ethnic groups are at risk of developing rosacea.  However, it is more common in women (especially around menopause) and adults with fair skin between the ages of 86 and 63.  Treatment Dermatologists typically recommend a combination of treatments to effectively manage rosacea.  Treatment can improve symptoms and may stop the progression of the rosacea.  Treatment may involve both topical and oral medications.  The tetracycline antibiotics are often used for their anti-inflammatory effect; however, because of the possibility of developing antibiotic resistance, they should not be used long term at full dose.  For dilated blood vessels the options include electrodessication (uses electric current through a small needle), laser  treatment, and cosmetics to hide the redness.   With all forms of treatment, improvement is a slow process, and patients may not see any results for the first 3-4 weeks.  It is very important to avoid the sun and other triggers.  Patients must wear sunscreen daily.  Skin Care Instructions: Cleanse the skin with a mild soap such as CeraVe cleanser, Cetaphil cleanser, or Dove soap once or twice daily as needed. Moisturize with Eucerin Redness Relief Daily Perfecting Lotion (has a subtle green tint), CeraVe Moisturizing Cream, or Oil of Olay Daily Moisturizer with sunscreen every morning and/or night as recommended. Makeup should be "non-comedogenic" (won't clog pores) and be labeled "for sensitive skin". Good choices for cosmetics are: Neutrogena, Almay, and Physician's Formula.  Any product with a green tint tends to offset a red complexion. If your eyes are dry and irritated, use artificial tears 2-3 times per day and cleanse the eyelids daily with baby shampoo.  Have your eyes examined at least every 2 years.  Be sure to tell your eye doctor that you have rosacea. Alcoholic beverages tend to cause flushing of the skin, and may make rosacea worse. Always wear sunscreen, protect your skin from extreme hot and cold temperatures, and avoid spicy foods, hot drinks, and mechanical irritation such as rubbing, scrubbing, or massaging the face.  Avoid harsh skin cleansers, cleansing masks, astringents, and exfoliation. If a particular product burns or makes your face feel tight, then it is likely to flare your rosacea. If you are having difficulty finding a sunscreen that you can tolerate, you  may try switching to a chemical-free sunscreen.  These are ones whose active ingredient is zinc oxide or titanium dioxide only.  They should also be fragrance free, non-comedogenic, and labeled for sensitive skin. Rosacea triggers may vary from person to person.  There are a variety of foods that have been reported to  trigger rosacea.  Some patients find that keeping a diary of what they were doing when they flared helps them avoid triggers.   Seborrheic Keratosis - arms Start otc 1% hydrocortisone cream to affected areas arms twice daily when irritated/itchy.   What causes seborrheic keratoses? Seborrheic keratoses are harmless, common skin growths that first appear during adult life.  As time goes by, more growths appear.  Some people may develop a large number of them.  Seborrheic keratoses appear on both covered and uncovered body parts.  They are not caused by sunlight.  The tendency to develop seborrheic keratoses can be inherited.  They vary in color from skin-colored to gray, brown, or even black.  They can be either smooth or have a rough, warty surface.   Seborrheic keratoses are superficial and look as if they were stuck on the skin.  Under the microscope this type of keratosis looks like layers upon layers of skin.  That is why at times the top layer may seem to fall off, but the rest of the growth remains and re-grows.    Treatment Seborrheic keratoses do not need to be treated, but can easily be removed in the office.  Seborrheic keratoses often cause symptoms when they rub on clothing or jewelry.  Lesions can be in the way of shaving.  If they become inflamed, they can cause itching, soreness, or burning.  Removal of a seborrheic keratosis can be accomplished by freezing, burning, or surgery. If any spot bleeds, scabs, or grows rapidly, please return to have it checked, as these can be an indication of a skin cancer.   Due to recent changes in healthcare laws, you may see results of your pathology and/or laboratory studies on MyChart before the doctors have had a chance to review them. We understand that in some cases there may be results that are confusing or concerning to you. Please understand that not all results are received at the same time and often the doctors may need to interpret multiple  results in order to provide you with the best plan of care or course of treatment. Therefore, we ask that you please give us  2 business days to thoroughly review all your results before contacting the office for clarification. Should we see a critical lab result, you will be contacted sooner.   If You Need Anything After Your Visit  If you have any questions or concerns for your doctor, please call our main line at [phone] and press option 4 to reach your doctor's medical assistant. If no one answers, please leave a voicemail as directed and we will return your call as soon as possible. Messages left after 4 pm will be answered the following business day.   You may also send us  a message via MyChart. We typically respond to MyChart messages within 1-2 business days.  For prescription refills, please ask your pharmacy to contact our office. Our fax number is [phone].  If you have an urgent issue when the clinic is closed that cannot wait until the next business day, you can page your doctor at the number below.    Please note that while we do our best  to be available for urgent issues outside of office hours, we are not available 24/7.   If you have an urgent issue and are unable to reach us , you may choose to seek medical care at your doctor's office, retail clinic, urgent care center, or emergency room.  If you have a medical emergency, please immediately call 911 or go to the emergency department.  Pager Numbers  - Dr. Bary Likes: [phone]  - Dr. Annette Barters: [phone]  - Dr. Felipe Horton: [phone]   In the event of inclement weather, please call our main line at [phone] for an update on the status of any delays or closures.  Dermatology Medication Tips: Please keep the boxes that topical medications come in in order to help keep track of the instructions about where and how to use these. Pharmacies typically print the medication instructions only on the boxes and not  directly on the medication tubes.   If your medication is too expensive, please contact our office at [phone] option 4 or send us  a message through MyChart.   We are unable to tell what your co-pay for medications will be in advance as this is different depending on your insurance coverage. However, we may be able to find a substitute medication at lower cost or fill out paperwork to get insurance to cover a needed medication.   If a prior authorization is required to get your medication covered by your insurance company, please allow us  1-2 business days to complete this process.  Drug prices often vary depending on where the prescription is filled and some pharmacies may offer cheaper prices.  The website www.goodrx.com contains coupons for medications through different pharmacies. The prices here do not account for what the cost may be with help from insurance (it may be cheaper with your insurance), but the website can give you the price if you did not use any insurance.  - You can print the associated coupon and take it with your prescription to the pharmacy.  - You may also stop by our office during regular business hours and pick up a GoodRx coupon card.  - If you need your prescription sent electronically to a different pharmacy, notify our office through [facility] or by phone at [phone] option 4.     Si Usted Necesita Algo Despus de Su Visita  Tambin puede enviarnos un mensaje a travs de Clinical cytogeneticist. Por lo general respondemos a los mensajes de MyChart en el transcurso de 1 a 2 das hbiles.  Para renovar recetas, por favor pida a su farmacia que se ponga en contacto con nuestra oficina. Franz Jacks de fax es [place] [phone].  Si tiene un asunto urgente cuando la clnica est cerrada y que no puede esperar hasta el siguiente da hbil, puede llamar/localizar a su doctor(a) al nmero que aparece a continuacin.   Por favor, tenga en cuenta que aunque hacemos  todo lo posible para estar disponibles para asuntos urgentes fuera del horario de [place], no estamos disponibles las 24 horas del da, los 7 [address]  [address] de la [place].   Si tiene un problema urgente y no puede comunicarse con nosotros, puede optar por buscar atencin mdica  en el consultorio de su doctor(a), en una clnica privada, en un centro de atencin urgente o en una sala de emergencias.  Si tiene Engineer, drilling, por favor llame inmediatamente al 911 o vaya a la sala de emergencias.  Nmeros de bper  - Dr. Bary Likes: [phone]  - Dra. Annette Barters: [phone]  -  Dr. Felipe Horton: [phone]   En caso de inclemencias del tiempo, por favor llame a nuestra lnea principal al [phone] para una actualizacin sobre el Juda de cualquier retraso o cierre.  Consejos para la medicacin en dermatologa: Por favor, guarde las cajas en las que vienen los medicamentos de uso tpico para ayudarle a seguir las instrucciones sobre dnde y cmo usarlos. Las farmacias generalmente imprimen las instrucciones del medicamento slo en las cajas y no directamente en los tubos del [place].   Si su medicamento es muy caro, por favor, pngase en contacto con Bettyjane Brunet llamando al [phone] y presione la opcin 4 o envenos un mensaje a travs de Clinical cytogeneticist.   No podemos decirle cul ser su copago por los medicamentos por adelantado ya que esto es diferente dependiendo de la cobertura de su seguro. Sin embargo, es posible que podamos encontrar un medicamento sustituto a Audiological scientist un formulario para que el seguro cubra el medicamento que se considera necesario.   Si se requiere una autorizacin previa para que su compaa de seguros [place] su medicamento, por favor permtanos de 1 a 2 das hbiles para completar este proceso.  Los precios de los medicamentos varan con frecuencia dependiendo del Environmental consultant de dnde se surte la receta y alguna farmacias pueden ofrecer precios ms baratos.  El  sitio web www.goodrx.com tiene cupones para medicamentos de Health and safety inspector. Los precios aqu no tienen en cuenta lo que podra costar con la ayuda del seguro (puede ser ms barato con su seguro), pero el sitio web puede darle el precio si no utiliz Tourist information centre manager.  - Puede imprimir el cupn correspondiente y llevarlo con su receta a la farmacia.  - Tambin puede pasar por nuestra oficina durante el horario de atencin regular y Education officer, museum una tarjeta de cupones de GoodRx.  - Si necesita que su receta se enve electrnicamente a una farmacia diferente, informe a nuestra oficina a travs de MyChart de [place] o por telfono llamando al [phone] y presione la opcin 4.

## 2024-12-11 ENCOUNTER — Ambulatory Visit: Admitting: Dermatology
